# Patient Record
Sex: Female | Born: 1935 | Race: White | Hispanic: No | Marital: Married | Smoking: Never smoker
Health system: Southern US, Community
[De-identification: ages and names within clinical notes are randomized; demographics above are authoritative.]

## PROBLEM LIST (undated history)

## (undated) HISTORY — PX: CATARACT EXTRACTION: SUR2

---

## 1999-02-25 ENCOUNTER — Other Ambulatory Visit: Admission: RE | Admit: 1999-02-25 | Discharge: 1999-02-25 | Payer: Self-pay | Admitting: Internal Medicine

## 2000-06-15 ENCOUNTER — Encounter: Payer: Self-pay | Admitting: Internal Medicine

## 2000-06-15 ENCOUNTER — Encounter: Admission: RE | Admit: 2000-06-15 | Discharge: 2000-06-15 | Payer: Self-pay | Admitting: Internal Medicine

## 2001-12-20 ENCOUNTER — Encounter: Payer: Self-pay | Admitting: Internal Medicine

## 2001-12-20 ENCOUNTER — Encounter: Admission: RE | Admit: 2001-12-20 | Discharge: 2001-12-20 | Payer: Self-pay | Admitting: Internal Medicine

## 2002-02-11 ENCOUNTER — Other Ambulatory Visit: Admission: RE | Admit: 2002-02-11 | Discharge: 2002-02-11 | Payer: Self-pay | Admitting: Internal Medicine

## 2003-10-08 ENCOUNTER — Encounter: Admission: RE | Admit: 2003-10-08 | Discharge: 2003-10-08 | Payer: Self-pay | Admitting: Internal Medicine

## 2006-03-28 ENCOUNTER — Encounter: Admission: RE | Admit: 2006-03-28 | Discharge: 2006-03-28 | Payer: Self-pay | Admitting: Internal Medicine

## 2009-04-22 ENCOUNTER — Encounter: Admission: RE | Admit: 2009-04-22 | Discharge: 2009-04-22 | Payer: Self-pay | Admitting: Psychiatry

## 2009-05-14 ENCOUNTER — Encounter: Admission: RE | Admit: 2009-05-14 | Discharge: 2009-05-14 | Payer: Self-pay | Admitting: Internal Medicine

## 2010-03-02 IMAGING — CT CT CHEST W/ CM
2 of 3 series · 15 of 30 positions shown, 17 images · IV contrast (75CC OMNI 300)
Comparison: Report of abdominal CT 11/09/2007 is reviewed.  Images
are not available at the time of dictation.

CLINICAL DATA: Follow-up lung nodule.

CT CHEST WITH CONTRAST
TECHNIQUE: Multidetector CT imaging of the chest was performed
following the standard protocol during bolus administration of
intravenous contrast.
Contrast: 75 ml [id number]-[id number]

[Series 3: routine chest · axial · 0.63mm/px · z∈[-232,-12]mm · 7 of 60 slices shown, 9 images]
[im 8/60  mediastinal]
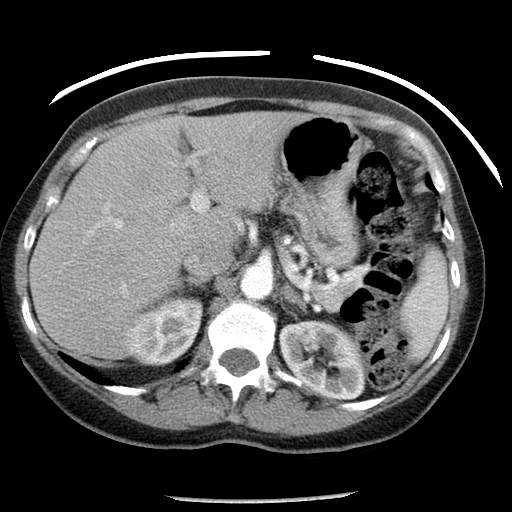
[im 8/60  lung]
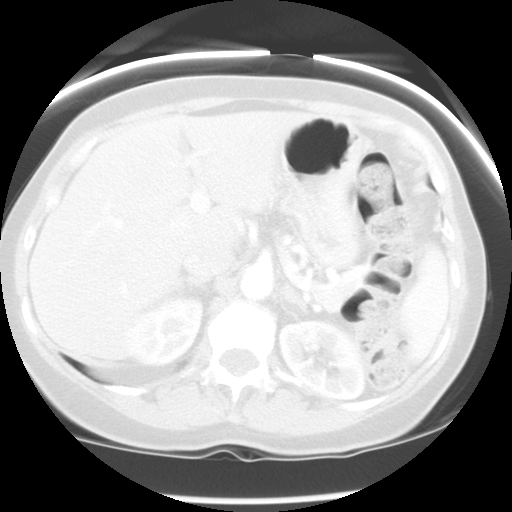
[im 15/60  lung]
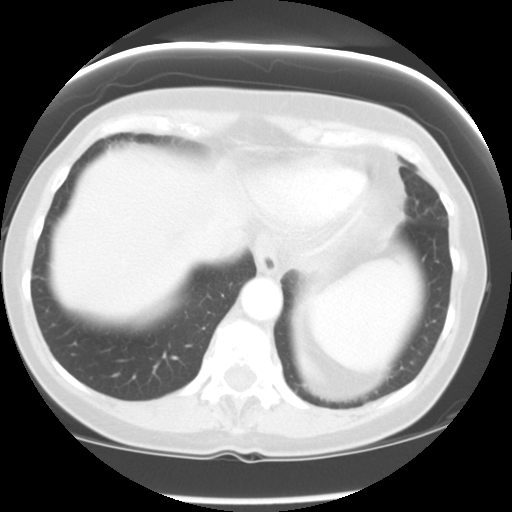
[im 23/60  lung]
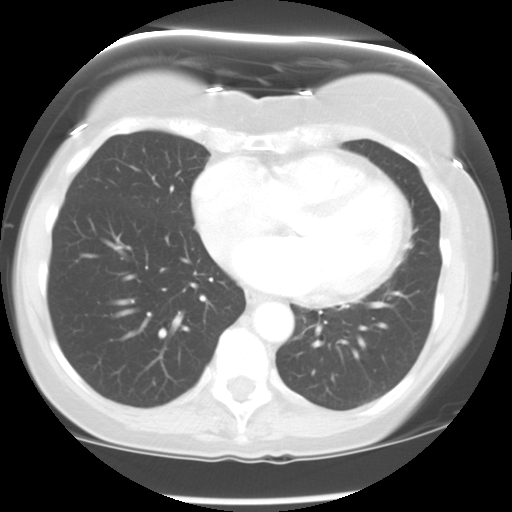
[im 30/60  lung]
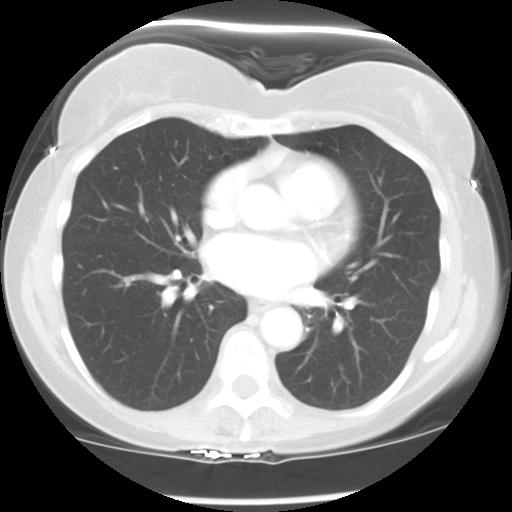
[im 37/60  mediastinal]
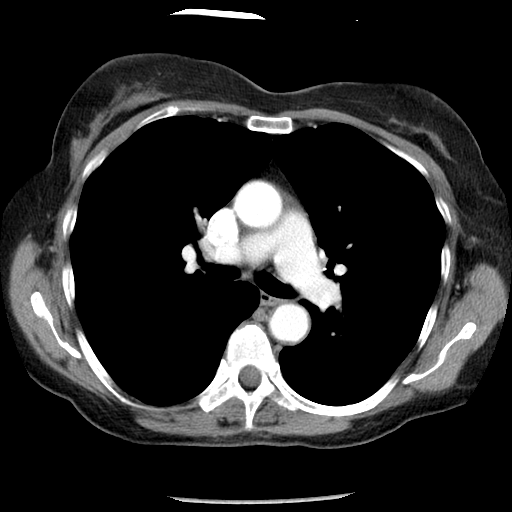
[im 37/60  lung]
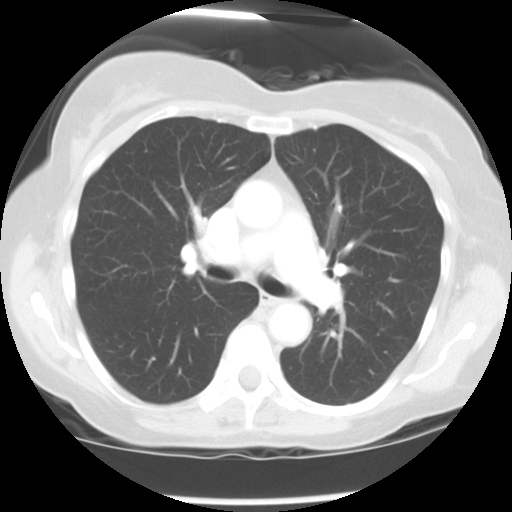
[im 45/60  lung]
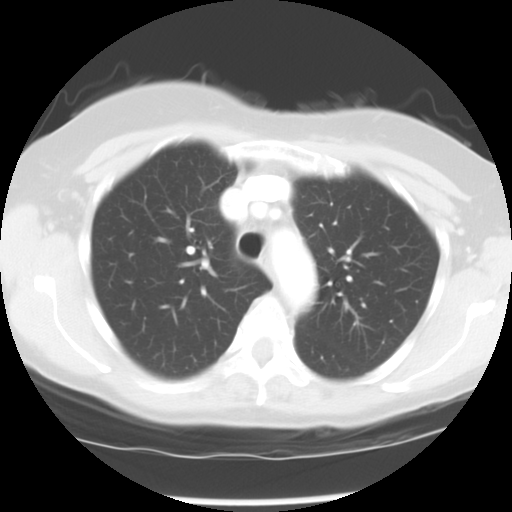
[im 52/60  lung]
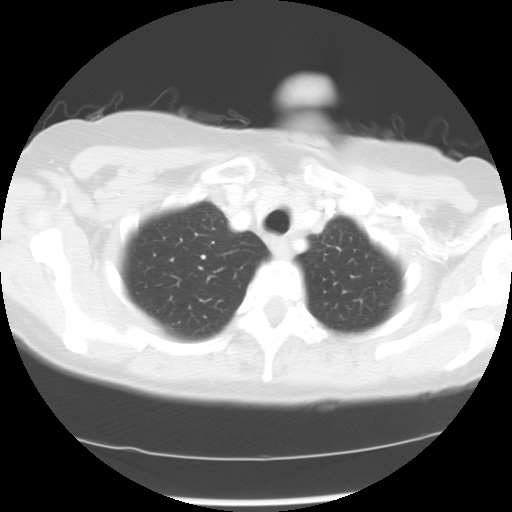

[Series 602: sagittal body · sagittal · 0.63mm/px · 8 of 130 slices shown]
[im 15/130  mediastinal]
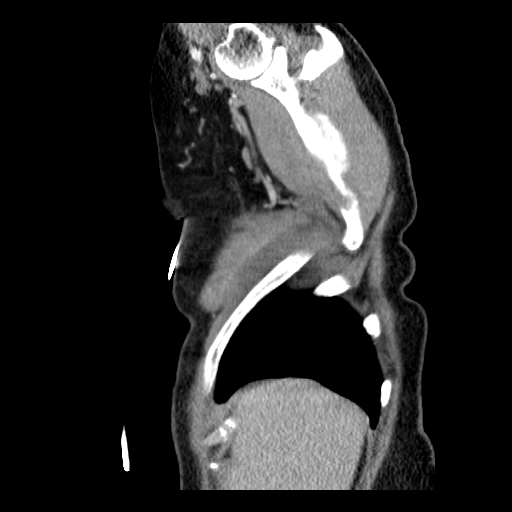
[im 29/130  mediastinal]
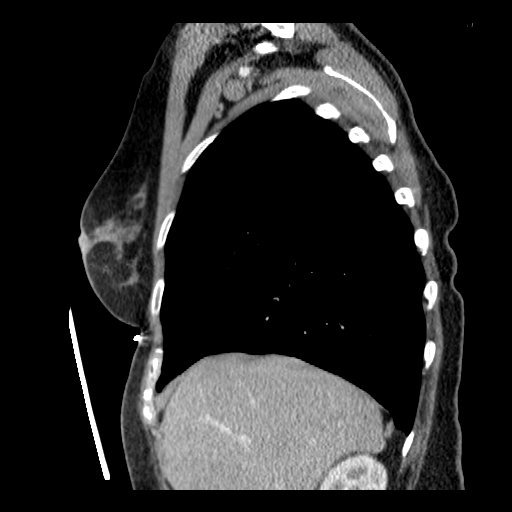
[im 44/130  mediastinal]
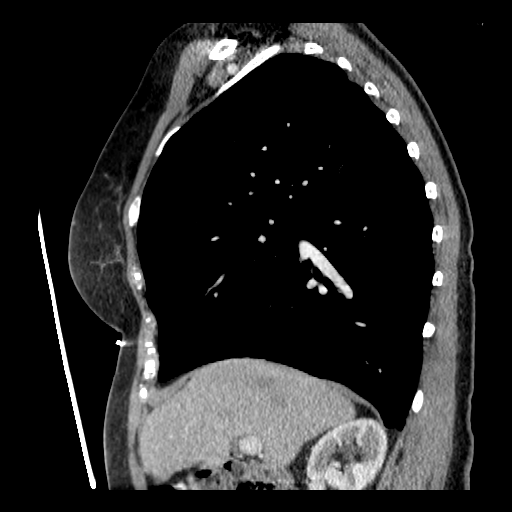
[im 58/130  mediastinal]
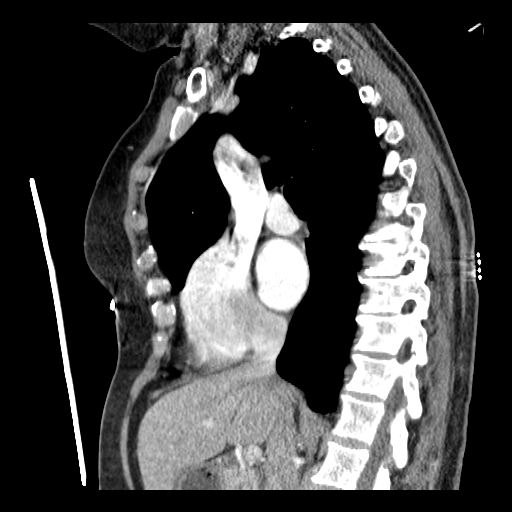
[im 72/130  mediastinal]
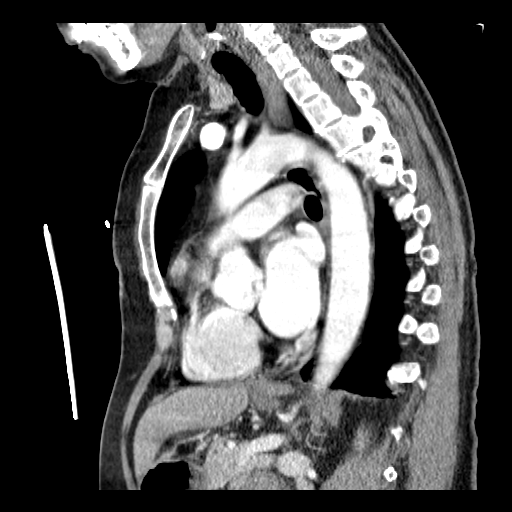
[im 87/130  mediastinal]
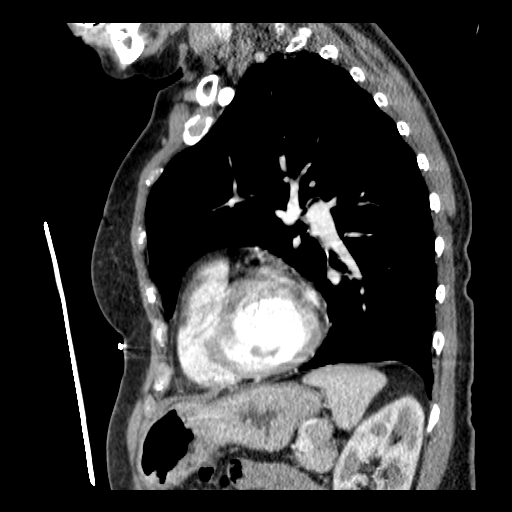
[im 101/130  mediastinal]
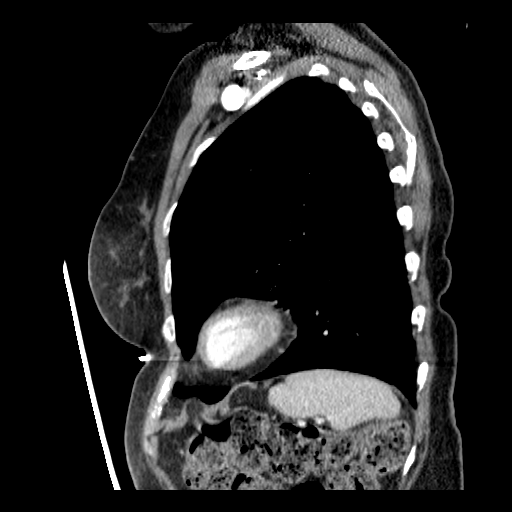
[im 115/130  mediastinal]
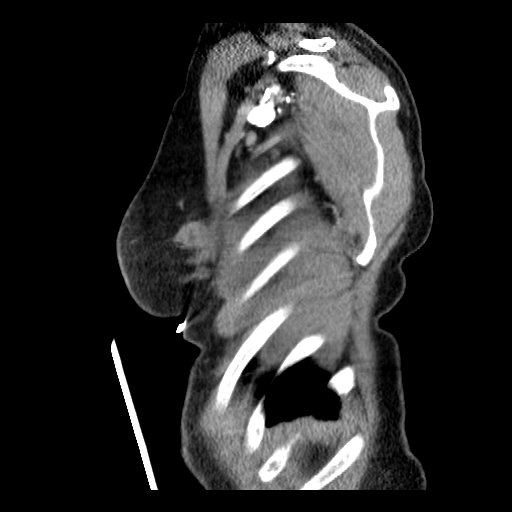

[15 of 30 positions shown; findings below may reference images not displayed]

FINDINGS: No pathologically enlarged mediastinal, hilar or axillary
lymph nodes.  Heart is enlarged.  No pericardial effusion.

Minimal biapical pleural parenchymal scarring.  There are small
subpleural nodules along the minor fissure and within the right
lower lobe.  Scarring is seen in the left lower lobe.  Lungs are
otherwise clear.  No pleural fluid.  Airway is unremarkable.

Incidental imaging of the upper abdomen shows a tiny low
attenuation lesion in the right kidney, which is likely a cyst.  No
worrisome lytic or sclerotic lesions.
IMPRESSION: Small subpleural nodules in the right lung base have the appearance
of subpleural lymph nodes.

## 2010-12-27 ENCOUNTER — Encounter: Payer: Self-pay | Admitting: Internal Medicine

## 2012-04-23 ENCOUNTER — Other Ambulatory Visit: Payer: Self-pay | Admitting: Internal Medicine

## 2012-04-23 DIAGNOSIS — Z1231 Encounter for screening mammogram for malignant neoplasm of breast: Secondary | ICD-10-CM

## 2012-05-22 ENCOUNTER — Ambulatory Visit
Admission: RE | Admit: 2012-05-22 | Discharge: 2012-05-22 | Disposition: A | Discharge status: Home / Self Care | Payer: Medicare Other | Source: Ambulatory Visit | Attending: Internal Medicine | Admitting: Internal Medicine

## 2012-05-22 DIAGNOSIS — Z1231 Encounter for screening mammogram for malignant neoplasm of breast: Secondary | ICD-10-CM

## 2014-05-16 ENCOUNTER — Other Ambulatory Visit: Payer: Self-pay | Admitting: Internal Medicine

## 2014-05-16 DIAGNOSIS — Z1231 Encounter for screening mammogram for malignant neoplasm of breast: Secondary | ICD-10-CM

## 2014-05-28 ENCOUNTER — Encounter (INDEPENDENT_AMBULATORY_CARE_PROVIDER_SITE_OTHER): Payer: Self-pay

## 2014-05-28 ENCOUNTER — Ambulatory Visit
Admission: RE | Admit: 2014-05-28 | Discharge: 2014-05-28 | Disposition: A | Discharge status: Home / Self Care | Payer: Medicare Other | Source: Ambulatory Visit | Attending: Internal Medicine | Admitting: Internal Medicine

## 2014-05-28 DIAGNOSIS — Z1231 Encounter for screening mammogram for malignant neoplasm of breast: Secondary | ICD-10-CM

## 2016-05-19 ENCOUNTER — Other Ambulatory Visit: Payer: Self-pay | Admitting: Internal Medicine

## 2016-05-19 DIAGNOSIS — Z1231 Encounter for screening mammogram for malignant neoplasm of breast: Secondary | ICD-10-CM

## 2016-06-22 ENCOUNTER — Ambulatory Visit: Payer: Self-pay

## 2019-12-15 ENCOUNTER — Ambulatory Visit: Payer: Medicare Other | Attending: Internal Medicine

## 2019-12-15 ENCOUNTER — Other Ambulatory Visit: Payer: Self-pay

## 2019-12-15 DIAGNOSIS — Z23 Encounter for immunization: Secondary | ICD-10-CM

## 2019-12-15 NOTE — Progress Notes (Signed)
   Covid-19 Vaccination Clinic  Name:  Audrey Sosa    MRN: [id number] DOB: 12/19/1929  12/15/2019  [name] was observed post Covid-19 immunization for 15 minutes without incidence. She was provided with Vaccine Information Sheet and instruction to access the V-Safe system.   [name] was instructed to call 911 with any severe reactions post vaccine: Marland Kitchen Difficulty breathing  . Swelling of your face and throat  . A fast heartbeat  . A bad rash all over your body  . Dizziness and weakness    Immunizations Administered    Name Date Dose VIS Date Route   Pfizer COVID-19 Vaccine 12/15/2019  2:22 PM 0.3 mL 11/15/2019 Intramuscular   Manufacturer: [employer]   Lot: [id number]   [place]: [id number]

## 2019-12-17 ENCOUNTER — Other Ambulatory Visit: Payer: Self-pay

## 2020-01-05 ENCOUNTER — Ambulatory Visit: Payer: Medicare Other | Attending: Internal Medicine

## 2020-01-05 DIAGNOSIS — Z23 Encounter for immunization: Secondary | ICD-10-CM | POA: Insufficient documentation

## 2020-01-05 NOTE — Progress Notes (Signed)
   Covid-19 Vaccination Clinic  Name:  ANASHA BILBREY    MRN: [id number] DOB: 1930-03-06  01/05/2020  Ms. Tennis was observed post Covid-19 immunization for 15 minutes without incidence. She was provided with Vaccine Information Sheet and instruction to access the V-Safe system.   Ms. Moretta was instructed to call 911 with any severe reactions post vaccine: Marland Kitchen Difficulty breathing  . Swelling of your face and throat  . A fast heartbeat  . A bad rash all over your body  . Dizziness and weakness    Immunizations Administered    Name Date Dose VIS Date Route   Pfizer COVID-19 Vaccine 01/05/2020  1:35 PM 0.3 mL 11/15/2019 Intramuscular   Manufacturer: [place]   Lot: [id number]   [place]: [id number]

## 2020-05-29 ENCOUNTER — Ambulatory Visit: Payer: Self-pay | Admitting: Podiatry

## 2020-12-16 DIAGNOSIS — M810 Age-related osteoporosis without current pathological fracture: Secondary | ICD-10-CM | POA: Diagnosis not present

## 2020-12-16 DIAGNOSIS — E782 Mixed hyperlipidemia: Secondary | ICD-10-CM | POA: Diagnosis not present

## 2020-12-16 DIAGNOSIS — I10 Essential (primary) hypertension: Secondary | ICD-10-CM | POA: Diagnosis not present

## 2021-01-21 DIAGNOSIS — I10 Essential (primary) hypertension: Secondary | ICD-10-CM | POA: Diagnosis not present

## 2021-04-20 DIAGNOSIS — I10 Essential (primary) hypertension: Secondary | ICD-10-CM | POA: Diagnosis not present

## 2021-04-25 DIAGNOSIS — L239 Allergic contact dermatitis, unspecified cause: Secondary | ICD-10-CM | POA: Diagnosis not present

## 2021-05-10 DIAGNOSIS — R21 Rash and other nonspecific skin eruption: Secondary | ICD-10-CM | POA: Diagnosis not present

## 2021-06-24 ENCOUNTER — Other Ambulatory Visit: Payer: Self-pay | Admitting: Critical Care Medicine

## 2021-06-24 DIAGNOSIS — Z1389 Encounter for screening for other disorder: Secondary | ICD-10-CM | POA: Diagnosis not present

## 2021-06-24 DIAGNOSIS — E559 Vitamin D deficiency, unspecified: Secondary | ICD-10-CM | POA: Diagnosis not present

## 2021-06-24 DIAGNOSIS — M810 Age-related osteoporosis without current pathological fracture: Secondary | ICD-10-CM | POA: Diagnosis not present

## 2021-06-24 DIAGNOSIS — K219 Gastro-esophageal reflux disease without esophagitis: Secondary | ICD-10-CM | POA: Diagnosis not present

## 2021-06-24 DIAGNOSIS — I10 Essential (primary) hypertension: Secondary | ICD-10-CM | POA: Diagnosis not present

## 2021-06-24 DIAGNOSIS — E782 Mixed hyperlipidemia: Secondary | ICD-10-CM | POA: Diagnosis not present

## 2021-06-24 DIAGNOSIS — Z0000 Encounter for general adult medical examination without abnormal findings: Secondary | ICD-10-CM | POA: Diagnosis not present

## 2021-06-28 ENCOUNTER — Other Ambulatory Visit: Payer: Self-pay

## 2021-06-28 ENCOUNTER — Ambulatory Visit
Admission: RE | Admit: 2021-06-28 | Discharge: 2021-06-28 | Disposition: A | Discharge status: Home / Self Care | Payer: Medicare Other | Source: Ambulatory Visit | Attending: Internal Medicine | Admitting: Internal Medicine

## 2021-06-28 DIAGNOSIS — Z780 Asymptomatic menopausal state: Secondary | ICD-10-CM | POA: Diagnosis not present

## 2021-06-28 DIAGNOSIS — M810 Age-related osteoporosis without current pathological fracture: Secondary | ICD-10-CM | POA: Diagnosis not present

## 2021-06-28 DIAGNOSIS — M8588 Other specified disorders of bone density and structure, other site: Secondary | ICD-10-CM | POA: Diagnosis not present

## 2021-07-15 DIAGNOSIS — Z961 Presence of intraocular lens: Secondary | ICD-10-CM | POA: Diagnosis not present

## 2021-07-15 DIAGNOSIS — H35363 Drusen (degenerative) of macula, bilateral: Secondary | ICD-10-CM | POA: Diagnosis not present

## 2021-07-15 DIAGNOSIS — H40013 Open angle with borderline findings, low risk, bilateral: Secondary | ICD-10-CM | POA: Diagnosis not present

## 2021-07-15 DIAGNOSIS — H524 Presbyopia: Secondary | ICD-10-CM | POA: Diagnosis not present

## 2021-07-15 DIAGNOSIS — H26493 Other secondary cataract, bilateral: Secondary | ICD-10-CM | POA: Diagnosis not present

## 2021-09-21 DIAGNOSIS — I10 Essential (primary) hypertension: Secondary | ICD-10-CM | POA: Diagnosis not present

## 2021-11-16 DIAGNOSIS — H109 Unspecified conjunctivitis: Secondary | ICD-10-CM | POA: Diagnosis not present

## 2021-12-16 DIAGNOSIS — R0989 Other specified symptoms and signs involving the circulatory and respiratory systems: Secondary | ICD-10-CM | POA: Diagnosis not present

## 2021-12-16 DIAGNOSIS — Z03818 Encounter for observation for suspected exposure to other biological agents ruled out: Secondary | ICD-10-CM | POA: Diagnosis not present

## 2021-12-16 DIAGNOSIS — L989 Disorder of the skin and subcutaneous tissue, unspecified: Secondary | ICD-10-CM | POA: Diagnosis not present

## 2021-12-16 DIAGNOSIS — J029 Acute pharyngitis, unspecified: Secondary | ICD-10-CM | POA: Diagnosis not present

## 2021-12-22 DIAGNOSIS — J209 Acute bronchitis, unspecified: Secondary | ICD-10-CM | POA: Diagnosis not present

## 2021-12-28 DIAGNOSIS — I10 Essential (primary) hypertension: Secondary | ICD-10-CM | POA: Diagnosis not present

## 2021-12-28 DIAGNOSIS — R059 Cough, unspecified: Secondary | ICD-10-CM | POA: Diagnosis not present

## 2022-01-18 DIAGNOSIS — M810 Age-related osteoporosis without current pathological fracture: Secondary | ICD-10-CM | POA: Diagnosis not present

## 2022-01-18 DIAGNOSIS — I10 Essential (primary) hypertension: Secondary | ICD-10-CM | POA: Diagnosis not present

## 2022-01-18 DIAGNOSIS — E782 Mixed hyperlipidemia: Secondary | ICD-10-CM | POA: Diagnosis not present

## 2022-02-01 DIAGNOSIS — C44311 Basal cell carcinoma of skin of nose: Secondary | ICD-10-CM | POA: Diagnosis not present

## 2022-03-01 DIAGNOSIS — Z85828 Personal history of other malignant neoplasm of skin: Secondary | ICD-10-CM | POA: Diagnosis not present

## 2022-03-01 DIAGNOSIS — Z08 Encounter for follow-up examination after completed treatment for malignant neoplasm: Secondary | ICD-10-CM | POA: Diagnosis not present

## 2022-04-25 DIAGNOSIS — L570 Actinic keratosis: Secondary | ICD-10-CM | POA: Diagnosis not present

## 2022-04-25 DIAGNOSIS — X32XXXD Exposure to sunlight, subsequent encounter: Secondary | ICD-10-CM | POA: Diagnosis not present

## 2022-07-07 DIAGNOSIS — I10 Essential (primary) hypertension: Secondary | ICD-10-CM | POA: Diagnosis not present

## 2022-07-07 DIAGNOSIS — M810 Age-related osteoporosis without current pathological fracture: Secondary | ICD-10-CM | POA: Diagnosis not present

## 2022-07-07 DIAGNOSIS — Z0000 Encounter for general adult medical examination without abnormal findings: Secondary | ICD-10-CM | POA: Diagnosis not present

## 2022-07-07 DIAGNOSIS — E559 Vitamin D deficiency, unspecified: Secondary | ICD-10-CM | POA: Diagnosis not present

## 2022-07-07 DIAGNOSIS — R54 Age-related physical debility: Secondary | ICD-10-CM | POA: Diagnosis not present

## 2022-07-07 DIAGNOSIS — E782 Mixed hyperlipidemia: Secondary | ICD-10-CM | POA: Diagnosis not present

## 2022-07-15 DIAGNOSIS — H35363 Drusen (degenerative) of macula, bilateral: Secondary | ICD-10-CM | POA: Diagnosis not present

## 2022-07-15 DIAGNOSIS — H40013 Open angle with borderline findings, low risk, bilateral: Secondary | ICD-10-CM | POA: Diagnosis not present

## 2022-07-15 DIAGNOSIS — H26493 Other secondary cataract, bilateral: Secondary | ICD-10-CM | POA: Diagnosis not present

## 2022-07-15 DIAGNOSIS — H524 Presbyopia: Secondary | ICD-10-CM | POA: Diagnosis not present

## 2022-07-15 DIAGNOSIS — H04123 Dry eye syndrome of bilateral lacrimal glands: Secondary | ICD-10-CM | POA: Diagnosis not present

## 2022-07-18 NOTE — Progress Notes (Signed)
  [facility] Note  07/20/2022     CHIEF COMPLAINT Patient presents for No chief complaint on file.   HISTORY OF PRESENT ILLNESS: Audrey Sosa is a 88 y.o. female who presents to the clinic today for:     Referring physician: Wenda Low, MD [address] [place],  [place] [address]  HISTORICAL INFORMATION:   Selected notes from the MEDICAL RECORD NUMBER Referred by Dr. Truman Hayward:  Ocular Hx- PMH-    CURRENT MEDICATIONS: No current outpatient medications on file. (Ophthalmic Drugs)   No current facility-administered medications for this visit. (Ophthalmic Drugs)   No current outpatient medications on file. (Other)   No current facility-administered medications for this visit. (Other)      REVIEW OF SYSTEMS:    ALLERGIES Not on File  PAST MEDICAL HISTORY No past medical history on file. *** The histories are not reviewed yet. Please review them in the "History" navigator section and refresh this National.  FAMILY HISTORY No family history on file.  SOCIAL HISTORY         OPHTHALMIC EXAM:  Not recorded     IMAGING AND PROCEDURES  Imaging and Procedures for 07/20/2022           ASSESSMENT/PLAN:  No diagnosis found.  1.  2.  3.  Ophthalmic Meds Ordered this visit:  No orders of the defined types were placed in this encounter.      No follow-ups on file.  There are no Patient Instructions on file for this visit.   Explained the diagnoses, plan, and follow up with the patient and they expressed understanding.  Patient expressed understanding of the importance of proper follow up care.   Gardiner Sleeper, M.D., Ph.D. Diseases & Surgery of the Retina and [place] '@TODAY'$ @     Abbreviations: M myopia (nearsighted); A astigmatism; H hyperopia (farsighted); P presbyopia; Mrx spectacle prescription;  CTL contact lenses; OD right eye; OS left eye; OU both  eyes  XT exotropia; ET esotropia; PEK punctate epithelial keratitis; PEE punctate epithelial erosions; DES dry eye syndrome; MGD meibomian gland dysfunction; ATs artificial tears; PFAT's preservative free artificial tears; [place] nuclear sclerotic cataract; PSC posterior subcapsular cataract; ERM epi-retinal membrane; PVD posterior vitreous detachment; RD retinal detachment; DM diabetes mellitus; DR diabetic retinopathy; NPDR non-proliferative diabetic retinopathy; PDR proliferative diabetic retinopathy; CSME clinically significant macular edema; DME diabetic macular edema; dbh dot blot hemorrhages; CWS cotton wool spot; POAG primary open angle glaucoma; C/D cup-to-disc ratio; HVF humphrey visual field; GVF goldmann visual field; OCT optical coherence tomography; IOP intraocular pressure; BRVO Branch retinal vein occlusion; CRVO central retinal vein occlusion; CRAO central retinal artery occlusion; BRAO branch retinal artery occlusion; RT retinal tear; SB scleral buckle; PPV pars plana vitrectomy; VH Vitreous hemorrhage; PRP panretinal laser photocoagulation; IVK intravitreal kenalog; VMT vitreomacular traction; MH Macular hole;  NVD neovascularization of the disc; NVE neovascularization elsewhere; AREDS age related eye disease study; ARMD age related macular degeneration; POAG primary open angle glaucoma; EBMD epithelial/anterior basement membrane dystrophy; ACIOL anterior chamber intraocular lens; IOL intraocular lens; PCIOL posterior chamber intraocular lens; Phaco/IOL phacoemulsification with intraocular lens placement; [place] photorefractive keratectomy; LASIK laser assisted in situ keratomileusis; HTN hypertension; DM diabetes mellitus; COPD chronic obstructive pulmonary disease

## 2022-07-20 ENCOUNTER — Encounter (INDEPENDENT_AMBULATORY_CARE_PROVIDER_SITE_OTHER): Payer: Self-pay | Admitting: Ophthalmology

## 2022-07-20 ENCOUNTER — Ambulatory Visit (INDEPENDENT_AMBULATORY_CARE_PROVIDER_SITE_OTHER): Payer: Medicare Other | Admitting: Ophthalmology

## 2022-07-20 DIAGNOSIS — Z961 Presence of intraocular lens: Secondary | ICD-10-CM | POA: Diagnosis not present

## 2022-07-20 DIAGNOSIS — H353231 Exudative age-related macular degeneration, bilateral, with active choroidal neovascularization: Secondary | ICD-10-CM | POA: Diagnosis not present

## 2022-07-20 DIAGNOSIS — H35033 Hypertensive retinopathy, bilateral: Secondary | ICD-10-CM

## 2022-07-20 DIAGNOSIS — I10 Essential (primary) hypertension: Secondary | ICD-10-CM

## 2022-07-20 DIAGNOSIS — H3581 Retinal edema: Secondary | ICD-10-CM

## 2022-07-20 MED ORDER — BEVACIZUMAB CHEMO INJECTION 1.25MG/0.05ML SYRINGE FOR KALEIDOSCOPE
1.2500 mg | INTRAVITREAL | Status: AC | PRN
  Administered 2022-07-20: 1.25 mg via INTRAVITREAL

## 2022-08-11 NOTE — Progress Notes (Signed)
[facility] Note  08/17/2022    CHIEF COMPLAINT Patient presents for Retina Follow Up   HISTORY OF PRESENT ILLNESS: Audrey Sosa is a 88 y.o. female who presents to the clinic today for:   HPI     Retina Follow Up   Patient presents with  Wet AMD.  In both eyes.  This started months ago.  Duration of 4 weeks.  Since onset it is stable.  I, the attending physician,  performed the HPI with the patient and updated documentation appropriately.        Comments   Patient denies noticing any vision changes at this time.       Last edited by Bernarda Caffey, MD on 08/17/2022 10:08 PM.    Pt states no problems with first injection at last visit, no change in vision  Referring physician: Lisabeth Pick, MD [place],  [place] [address]  HISTORICAL INFORMATION:   Selected notes from the MEDICAL RECORD NUMBER Referred by Dr. Lucianne Lei for ex ARMD LEE:  Ocular Hx- PMH-    CURRENT MEDICATIONS: No current outpatient medications on file. (Ophthalmic Drugs)   No current facility-administered medications for this visit. (Ophthalmic Drugs)   Current Outpatient Medications (Other)  Medication Sig   Cholecalciferol (VITAMIN D3) 1.25 MG (50000 UT) CAPS Take by mouth as directed.   Multiple Vitamins-Minerals (MULTIVITAMIN ADULT EXTRA C PO)    raloxifene (EVISTA) 60 MG tablet Take 60 mg by mouth daily.   simvastatin (ZOCOR) 20 MG tablet Take 20 mg by mouth daily.   valsartan-hydrochlorothiazide (DIOVAN-HCT) 160-12.5 MG tablet Take by mouth as directed.   No current facility-administered medications for this visit. (Other)   REVIEW OF SYSTEMS: ROS   Positive for: Eyes Negative for: Constitutional, Gastrointestinal, Neurological, Skin, Genitourinary, Musculoskeletal, HENT, Endocrine, Cardiovascular, Respiratory, Psychiatric, Allergic/Imm, Heme/Lymph Last edited by Annie Paras, COT on 08/17/2022  2:17 PM.     ALLERGIES No Known  Allergies  PAST MEDICAL HISTORY History reviewed. No pertinent past medical history. Past Surgical History:  Procedure Laterality Date   CATARACT EXTRACTION     FAMILY HISTORY Family History  Problem Relation Age of Onset   Macular degeneration Mother    Macular degeneration Father    SOCIAL HISTORY Social History   Tobacco Use   Smoking status: Never   Smokeless tobacco: Never       OPHTHALMIC EXAM:  Base Eye Exam     Visual Acuity (Snellen - Linear)       Right Left   Dist Thermal 20/25 +2 20/40   Dist ph [place]  NI         Tonometry (Tonopen, 2:20 PM)       Right Left   Pressure 14 14         Pupils       Dark Light Shape React APD   Right 3 2 Round Slow None   Left 3 2 Round Slow None         Visual Fields       Left Right    Full Full         Extraocular Movement       Right Left    Full, Ortho Full, Ortho         Neuro/Psych     Oriented x3: Yes   Mood/Affect: Normal         Dilation     Both eyes: 1.0% Mydriacyl, 2.5% Phenylephrine @  2:17 PM           Slit Lamp and Fundus Exam     Slit Lamp Exam       Right Left   Lids/Lashes Dermatochalasis - upper lid Dermatochalasis - upper lid   Conjunctiva/Sclera White and quiet White and quiet   Cornea arcus, trace PEE, mild tear film debris arcus, trace PEE, mild tear film debris   Anterior Chamber Deep and quiet Deep and quiet   Iris Round and dilated Round and dilated   Lens PC IOL in good position, trace Posterior capsular opacification PC IOL in good position, 1+ Posterior capsular opacification   Anterior Vitreous Vitreous syneresis Vitreous syneresis         Fundus Exam       Right Left   Disc Pink and Sharp, Compact, temporal PPA, peripapillary CNE with edema superior disc, no heme mild Pallor, Sharp rim, PPA   C/D Ratio 0.5 0.6   Macula Flat, Blunted foveal reflex, fine Drusen, RPE mottling and clumping, No heme or edema Blunted foveal reflex, central CNV with  edema - improved, no frank heme, Drusen, RPE mottling   Vessels mild attenuation, mild tortuosity attenuated, Tortuous   Periphery Attached, mild reticular degeneration, No heme Attached, mild reticular degeneration, No heme           IMAGING AND PROCEDURES  Imaging and Procedures for 08/17/2022  OCT, Retina - OU - Both Eyes       Right Eye Quality was good. Central Foveal Thickness: 297. Progression has improved. Findings include normal foveal contour, no IRF, retinal drusen , subretinal hyper-reflective material, pigment epithelial detachment, subretinal fluid (Peripapillary PED / CNV with mild cystic changes superior to disc - slightly improved - caught on widefield, Mild ERM).   Left Eye Quality was good. Central Foveal Thickness: 490. Progression has improved. Findings include abnormal foveal contour, intraretinal fluid, pigment epithelial detachment, subretinal fluid (Interval improvement in SRF and foveal contour overlying stable PED).   Notes *Images captured and stored on drive  Diagnosis / Impression:  Exu ARMD OU OD: Peripapillary PED / CNV with mild cystic changes superior to disc - slightly improved - caught on widefield; Mild ERM OS: Interval improvement in SRF and foveal contour overlying stable PED  Clinical management:  See below  Abbreviations: NFP - Normal foveal profile. CME - cystoid macular edema. PED - pigment epithelial detachment. IRF - intraretinal fluid. SRF - subretinal fluid. EZ - ellipsoid zone. ERM - epiretinal membrane. ORA - outer retinal atrophy. ORT - outer retinal tubulation. SRHM - subretinal hyper-reflective material. IRHM - intraretinal hyper-reflective material      Intravitreal Injection, Pharmacologic Agent - OS - Left Eye       Time Out 08/17/2022. 3:12 PM. Confirmed correct patient, procedure, site, and patient consented.   Anesthesia Topical anesthesia was used. Anesthetic medications included Lidocaine 4%, Proparacaine 0.5%.    Procedure Preparation included 5% betadine to ocular surface, eyelid speculum. A (32g) needle was used.   Injection: 1.25 mg Bevacizumab 1.'25mg'$ /0.54m   Route: Intravitreal, Site: Left Eye   NDC: 5H061816 Lot:: [id number] Expiration date: 09/27/2022   Post-op Post injection exam found visual acuity of at least counting fingers. The patient tolerated the procedure well. There were no complications. The patient received written and verbal post procedure care education. Post injection medications were not given.      Fluorescein Angiography Optos (Transit OS)       Right Eye Progression has no prior data. Early phase  findings include staining, choroidal neovascularization. Mid/Late phase findings include leakage, staining, choroidal neovascularization (Focal peripapillary CNV with leakage superior disc).   Left Eye Progression has no prior data. Early phase findings include blockage, staining, choroidal neovascularization. Mid/Late phase findings include blockage, leakage, staining, choroidal neovascularization (Central CNV with central blockage from heme, +leakage).   Notes **Images stored on drive**  Impression: Exudative ARMD OU OD: Focal peripapillary CNV with leakage superior disc OS: Central CNV with central blockage from heme, +leakage      Intravitreal Injection, Pharmacologic Agent - OD - Right Eye       Time Out 08/17/2022. 3:19 PM. Confirmed correct patient, procedure, site, and patient consented.   Anesthesia Topical anesthesia was used. Anesthetic medications included Lidocaine 2%, Proparacaine 0.5%.   Procedure Preparation included 5% betadine to ocular surface, eyelid speculum. A supplied needle was used.   Injection: 1.25 mg Bevacizumab 1.'25mg'$ /0.30m   Route: Intravitreal, Site: Right Eye   NDC: 5H061816 Lot: [id number]'@[id number]'$ , Expiration date: 10/05/2022   Post-op Post injection exam found visual acuity of at least counting fingers. The patient  tolerated the procedure well. There were no complications. The patient received written and verbal post procedure care education.            ASSESSMENT/PLAN:    ICD-10-CM   1. Exudative age-related macular degeneration of both eyes with active choroidal neovascularization (HCC)  H35.3231 OCT, Retina - OU - Both Eyes    Intravitreal Injection, Pharmacologic Agent - OS - Left Eye    Fluorescein Angiography Optos (Transit OS)    Intravitreal Injection, Pharmacologic Agent - OD - Right Eye    Bevacizumab (AVASTIN) SOLN 1.25 mg    Bevacizumab (AVASTIN) SOLN 1.25 mg    2. Essential hypertension  I10     3. Hypertensive retinopathy of both eyes  H35.033 Fluorescein Angiography Optos (Transit OS)    4. Pseudophakia, both eyes  Z96.1      Exudative age related macular degeneration, OU  - OD: small peripapillary CNV  - OS: central CNV  - s/p IVA OS #1 (08.16.23)  - BCVA 20/20 OD, 20/40 OS  - OCT shows OD: Peripapillary PED / CNV with mild cystic changes superior to disc - slightly improved - caught on widefield, Mild ERM; OS: Interval improvement in SRF and foveal contour overlying stable PED  - FA (09.13.23) shows OD: Focal peripapillary CNV with leakage superior disc; OS: Central CNV with central blockage from heme, +leakage  - recommend IVA OU (OD #1 and OS #2) today, 09.13.23 - pt wishes to proceed with injection - RBA of procedure discussed, questions answered - IVA informed consent obtained and signed, 09.13.23 (OU) - see procedure note  - f/u in 4 wks -- DFE/OCT/possible injection(s)  2,3. Hypertensive retinopathy OU - discussed importance of tight BP control - monitor  4. Pseudophakia OU  - s/p CE/IOL OU (Dr. [id number]  - IOLs in good position, doing well  - monitor  Ophthalmic Meds Ordered this visit:  Meds ordered this encounter  Medications   Bevacizumab (AVASTIN) SOLN 1.25 mg   Bevacizumab (AVASTIN) SOLN 1.25 mg     Return in about 4 weeks (around 09/14/2022) for  f/u exu ARMD OU, DFE, OCT.  There are no Patient Instructions on file for this visit.  Explained the diagnoses, plan, and follow up with the patient and they expressed understanding.  Patient expressed understanding of the importance of proper follow up care.   This document serves as a record of services personally  performed by Gardiner Sleeper, MD, PhD. It was created on their behalf by Orvan Falconer, an ophthalmic technician. The creation of this record is the provider's dictation and/or activities during the visit.    Electronically signed by: Orvan Falconer, OA, 08/21/22  1:32 AM This document serves as a record of services personally performed by Gardiner Sleeper, MD, PhD. It was created on their behalf by San Jetty. Owens Shark, OA an ophthalmic technician. The creation of this record is the provider's dictation and/or activities during the visit.    Electronically signed by: San Jetty. Owens Shark, [place] 09.13.2023 1:32 AM  Gardiner Sleeper, M.D., Ph.D. Diseases & Surgery of the Retina and Vitreous [facility]  I have reviewed the above documentation for accuracy and completeness, and I agree with the above. Gardiner Sleeper, M.D., Ph.D. 08/21/22 1:33 AM   Abbreviations: M myopia (nearsighted); A astigmatism; H hyperopia (farsighted); P presbyopia; Mrx spectacle prescription;  CTL contact lenses; OD right eye; OS left eye; OU both eyes  XT exotropia; ET esotropia; PEK punctate epithelial keratitis; PEE punctate epithelial erosions; DES dry eye syndrome; MGD meibomian gland dysfunction; ATs artificial tears; PFAT's preservative free artificial tears; [place] nuclear sclerotic cataract; PSC posterior subcapsular cataract; ERM epi-retinal membrane; PVD posterior vitreous detachment; RD retinal detachment; DM diabetes mellitus; DR diabetic retinopathy; NPDR non-proliferative diabetic retinopathy; PDR proliferative diabetic retinopathy; CSME clinically significant macular edema; DME  diabetic macular edema; dbh dot blot hemorrhages; CWS cotton wool spot; POAG primary open angle glaucoma; C/D cup-to-disc ratio; HVF humphrey visual field; GVF goldmann visual field; OCT optical coherence tomography; IOP intraocular pressure; BRVO Branch retinal vein occlusion; CRVO central retinal vein occlusion; CRAO central retinal artery occlusion; BRAO branch retinal artery occlusion; RT retinal tear; SB scleral buckle; PPV pars plana vitrectomy; VH Vitreous hemorrhage; PRP panretinal laser photocoagulation; IVK intravitreal kenalog; VMT vitreomacular traction; MH Macular hole;  NVD neovascularization of the disc; NVE neovascularization elsewhere; AREDS age related eye disease study; ARMD age related macular degeneration; POAG primary open angle glaucoma; EBMD epithelial/anterior basement membrane dystrophy; ACIOL anterior chamber intraocular lens; IOL intraocular lens; PCIOL posterior chamber intraocular lens; Phaco/IOL phacoemulsification with intraocular lens placement; [place] photorefractive keratectomy; LASIK laser assisted in situ keratomileusis; HTN hypertension; DM diabetes mellitus; COPD chronic obstructive pulmonary disease

## 2022-08-17 ENCOUNTER — Ambulatory Visit (INDEPENDENT_AMBULATORY_CARE_PROVIDER_SITE_OTHER): Payer: Medicare Other | Admitting: Ophthalmology

## 2022-08-17 ENCOUNTER — Encounter (INDEPENDENT_AMBULATORY_CARE_PROVIDER_SITE_OTHER): Payer: Self-pay | Admitting: Ophthalmology

## 2022-08-17 DIAGNOSIS — H353231 Exudative age-related macular degeneration, bilateral, with active choroidal neovascularization: Secondary | ICD-10-CM | POA: Diagnosis not present

## 2022-08-17 DIAGNOSIS — I10 Essential (primary) hypertension: Secondary | ICD-10-CM | POA: Diagnosis not present

## 2022-08-17 DIAGNOSIS — H35033 Hypertensive retinopathy, bilateral: Secondary | ICD-10-CM | POA: Diagnosis not present

## 2022-08-17 DIAGNOSIS — Z961 Presence of intraocular lens: Secondary | ICD-10-CM

## 2022-08-17 MED ORDER — BEVACIZUMAB CHEMO INJECTION 1.25MG/0.05ML SYRINGE FOR KALEIDOSCOPE
1.2500 mg | INTRAVITREAL | Status: AC | PRN
  Administered 2022-08-17: 1.25 mg via INTRAVITREAL

## 2022-09-14 ENCOUNTER — Encounter (INDEPENDENT_AMBULATORY_CARE_PROVIDER_SITE_OTHER): Payer: Medicare Other | Admitting: Ophthalmology

## 2022-09-16 NOTE — Progress Notes (Shared)
[facility] Note  09/30/2022    CHIEF COMPLAINT Patient presents for No chief complaint on file.   HISTORY OF PRESENT ILLNESS: Audrey Sosa is a 88 y.o. female who presents to the clinic today for:    Pt states no problems with first injection at last visit, no change in vision  Referring physician: Wenda Low, MD [address] [place],  [place] [address]  HISTORICAL INFORMATION:   Selected notes from the MEDICAL RECORD NUMBER Referred by Dr. Lucianne Lei for ex ARMD LEE:  Ocular Hx- PMH-    CURRENT MEDICATIONS: No current outpatient medications on file. (Ophthalmic Drugs)   No current facility-administered medications for this visit. (Ophthalmic Drugs)   Current Outpatient Medications (Other)  Medication Sig   Cholecalciferol (VITAMIN D3) 1.25 MG (50000 UT) CAPS Take by mouth as directed.   Multiple Vitamins-Minerals (MULTIVITAMIN ADULT EXTRA C PO)    raloxifene (EVISTA) 60 MG tablet Take 60 mg by mouth daily.   simvastatin (ZOCOR) 20 MG tablet Take 20 mg by mouth daily.   valsartan-hydrochlorothiazide (DIOVAN-HCT) 160-12.5 MG tablet Take by mouth as directed.   No current facility-administered medications for this visit. (Other)   REVIEW OF SYSTEMS:   ALLERGIES No Known Allergies  PAST MEDICAL HISTORY No past medical history on file. Past Surgical History:  Procedure Laterality Date   CATARACT EXTRACTION     FAMILY HISTORY Family History  Problem Relation Age of Onset   Macular degeneration Mother    Macular degeneration Father    SOCIAL HISTORY Social History   Tobacco Use   Smoking status: Never   Smokeless tobacco: Never       OPHTHALMIC EXAM:  Not recorded    IMAGING AND PROCEDURES  Imaging and Procedures for 09/30/2022          ASSESSMENT/PLAN:    ICD-10-CM   1. Exudative age-related macular degeneration of both eyes with active choroidal neovascularization ([place])  H35.3231     2.  Essential hypertension  I10     3. Hypertensive retinopathy of both eyes  H35.033     4. Pseudophakia, both eyes  Z96.1       Exudative age related macular degeneration, OU  - OD: small peripapillary CNV  - OS: central CNV  - s/p IVA OS #1 (09.13.23)  - s/p IVA OS #1 (08.16.23), #2 (09.13.23)  - BCVA 20/20 OD, 20/40 OS  - OCT shows OD: Peripapillary PED / CNV with mild cystic changes superior to disc - slightly improved - caught on widefield, Mild ERM; OS: Interval improvement in SRF and foveal contour overlying stable PED  - FA (09.13.23) shows OD: Focal peripapillary CNV with leakage superior disc; OS: Central CNV with central blockage from heme, +leakage  - recommend IVA OU (OD #2 and OS #3) today, 10.27.23 - pt wishes to proceed with injection - RBA of procedure discussed, questions answered - IVA informed consent obtained and signed, 09.13.23 (OU) - see procedure note  - f/u in 4 wks -- DFE/OCT/possible injection(s)  2,3. Hypertensive retinopathy OU - discussed importance of tight BP control - monitor  4. Pseudophakia OU  - s/p CE/IOL OU (Dr. Herbert Deaner)  - IOLs in good position, doing well  - monitor  Ophthalmic Meds Ordered this visit:  No orders of the defined types were placed in this encounter.    No follow-ups on file.  There are no Patient Instructions on file for this visit.  Explained the diagnoses,  plan, and follow up with the patient and they expressed understanding.  Patient expressed understanding of the importance of proper follow up care.   This document serves as a record of services personally performed by Gardiner Sleeper, MD, PhD. It was created on their behalf by Renaldo Reel, [place] an ophthalmic technician. The creation of this record is the provider's dictation and/or activities during the visit.    Electronically signed by:  Renaldo Reel, COT  10.13.23 9:35 AM   Gardiner Sleeper, M.D., Ph.D. Diseases & Surgery of the Retina and  Vitreous [facility]: M myopia (nearsighted); A astigmatism; H hyperopia (farsighted); P presbyopia; Mrx spectacle prescription;  CTL contact lenses; OD right eye; OS left eye; OU both eyes  XT exotropia; ET esotropia; PEK punctate epithelial keratitis; PEE punctate epithelial erosions; DES dry eye syndrome; MGD meibomian gland dysfunction; ATs artificial tears; PFAT's preservative free artificial tears; [place] nuclear sclerotic cataract; PSC posterior subcapsular cataract; ERM epi-retinal membrane; PVD posterior vitreous detachment; RD retinal detachment; DM diabetes mellitus; DR diabetic retinopathy; NPDR non-proliferative diabetic retinopathy; PDR proliferative diabetic retinopathy; CSME clinically significant macular edema; DME diabetic macular edema; dbh dot blot hemorrhages; CWS cotton wool spot; POAG primary open angle glaucoma; C/D cup-to-disc ratio; HVF humphrey visual field; GVF goldmann visual field; OCT optical coherence tomography; IOP intraocular pressure; BRVO Branch retinal vein occlusion; CRVO central retinal vein occlusion; CRAO central retinal artery occlusion; BRAO branch retinal artery occlusion; RT retinal tear; SB scleral buckle; PPV pars plana vitrectomy; VH Vitreous hemorrhage; PRP panretinal laser photocoagulation; IVK intravitreal kenalog; VMT vitreomacular traction; MH Macular hole;  NVD neovascularization of the disc; NVE neovascularization elsewhere; AREDS age related eye disease study; ARMD age related macular degeneration; POAG primary open angle glaucoma; EBMD epithelial/anterior basement membrane dystrophy; ACIOL anterior chamber intraocular lens; IOL intraocular lens; PCIOL posterior chamber intraocular lens; Phaco/IOL phacoemulsification with intraocular lens placement; [place] photorefractive keratectomy; LASIK laser assisted in situ keratomileusis; HTN hypertension; DM diabetes mellitus; COPD chronic obstructive pulmonary disease

## 2022-09-29 DIAGNOSIS — R54 Age-related physical debility: Secondary | ICD-10-CM | POA: Diagnosis not present

## 2022-09-29 DIAGNOSIS — Z23 Encounter for immunization: Secondary | ICD-10-CM | POA: Diagnosis not present

## 2022-09-29 DIAGNOSIS — I10 Essential (primary) hypertension: Secondary | ICD-10-CM | POA: Diagnosis not present

## 2022-09-30 ENCOUNTER — Encounter (INDEPENDENT_AMBULATORY_CARE_PROVIDER_SITE_OTHER): Payer: Medicare Other | Admitting: Ophthalmology

## 2022-09-30 DIAGNOSIS — I10 Essential (primary) hypertension: Secondary | ICD-10-CM

## 2022-09-30 DIAGNOSIS — H35033 Hypertensive retinopathy, bilateral: Secondary | ICD-10-CM

## 2022-09-30 DIAGNOSIS — H353231 Exudative age-related macular degeneration, bilateral, with active choroidal neovascularization: Secondary | ICD-10-CM

## 2022-09-30 DIAGNOSIS — Z961 Presence of intraocular lens: Secondary | ICD-10-CM

## 2022-10-04 NOTE — Progress Notes (Signed)
[facility] Note  10/18/2022    CHIEF COMPLAINT Patient presents for Retina Follow Up   HISTORY OF PRESENT ILLNESS: Audrey Sosa is a 88 y.o. female who presents to the clinic today for:   HPI     Retina Follow Up   Patient presents with  Wet AMD.  In both eyes.  This started 4 weeks ago.  Duration of 4 weeks.  Since onset it is stable.  I, the attending physician,  performed the HPI with the patient and updated documentation appropriately.        Comments   4 week retina EX AMRD OU and IVA OU pt states no vision changes noticed       Last edited by Bernarda Caffey, MD on 10/18/2022 11:04 PM.     Pt states no problems with first injection at last visit, no change in vision  Referring physician: Wenda Low, MD [address] [place],  [place] [address]  HISTORICAL INFORMATION:   Selected notes from the MEDICAL RECORD NUMBER Referred by Dr. Lucianne Lei for ex ARMD LEE:  Ocular Hx- PMH-    CURRENT MEDICATIONS: No current outpatient medications on file. (Ophthalmic Drugs)   No current facility-administered medications for this visit. (Ophthalmic Drugs)   Current Outpatient Medications (Other)  Medication Sig   Cholecalciferol (VITAMIN D3) 1.25 MG (50000 UT) CAPS Take by mouth as directed.   Multiple Vitamins-Minerals (MULTIVITAMIN ADULT EXTRA C PO)    raloxifene (EVISTA) 60 MG tablet Take 60 mg by mouth daily.   simvastatin (ZOCOR) 20 MG tablet Take 20 mg by mouth daily.   valsartan-hydrochlorothiazide (DIOVAN-HCT) 160-12.5 MG tablet Take by mouth as directed.   No current facility-administered medications for this visit. (Other)   REVIEW OF SYSTEMS:   ALLERGIES No Known Allergies  PAST MEDICAL HISTORY History reviewed. No pertinent past medical history. Past Surgical History:  Procedure Laterality Date   CATARACT EXTRACTION     FAMILY HISTORY Family History  Problem Relation Age of Onset   Macular degeneration  Mother    Macular degeneration Father    SOCIAL HISTORY Social History   Tobacco Use   Smoking status: Never   Smokeless tobacco: Never       OPHTHALMIC EXAM:  Base Eye Exam     Visual Acuity (Snellen - Linear)       Right Left   Dist [place] 20/25 -2 20/40 -1   Dist ph [place] NI NI         Tonometry (Tonopen, 2:04 PM)       Right Left   Pressure 10 10         Pupils       Pupils Dark Light Shape React APD   Right PERRL 3 2 Round Minimal None   Left PERRL 3 2 Round Minimal None         Visual Fields       Left Right    Full Full         Extraocular Movement       Right Left    Full, Ortho Full, Ortho         Neuro/Psych     Oriented x3: Yes   Mood/Affect: Normal         Dilation     Both eyes: 2.5% Phenylephrine @ 2:03 PM           Slit Lamp and Fundus Exam     Slit Lamp  Exam       Right Left   Lids/Lashes Dermatochalasis - upper lid Dermatochalasis - upper lid   Conjunctiva/Sclera White and quiet White and quiet   Cornea arcus, trace PEE, mild tear film debris arcus, trace PEE, mild tear film debris   Anterior Chamber Deep and quiet Deep and quiet   Iris Round and dilated Round and dilated   Lens PC IOL in good position, trace Posterior capsular opacification PC IOL in good position, 1+ Posterior capsular opacification   Anterior Vitreous Vitreous syneresis Vitreous syneresis         Fundus Exam       Right Left   Disc Pink and Sharp, Compact, temporal PPA, peripapillary CNV with edema superior disc -- slightly increased, no heme mild Pallor, Sharp rim, PPA   C/D Ratio 0.5 0.6   Macula Flat, Blunted foveal reflex, fine Drusen, RPE mottling and clumping, No heme or edema Blunted foveal reflex, central CNV with edema - increased, no frank heme, Drusen, RPE mottling   Vessels mild attenuation, mild tortuosity attenuated, Tortuous   Periphery Attached, mild reticular degeneration, No heme Attached, mild reticular degeneration, No  heme           IMAGING AND PROCEDURES  Imaging and Procedures for 10/18/2022  OCT, Retina - OU - Both Eyes       Right Eye Quality was good. Central Foveal Thickness: 294. Progression has worsened. Findings include normal foveal contour, no SRF, retinal drusen , subretinal hyper-reflective material, intraretinal fluid, pigment epithelial detachment (Peripapillary PED / CNV with mild cystic changes superior to disc - slightly increased - caught on widefield, Mild ERM).   Left Eye Quality was good. Central Foveal Thickness: 478. Progression has worsened. Findings include abnormal foveal contour, intraretinal fluid, pigment epithelial detachment, subretinal fluid (Interval increase in SRF overlying central PED).   Notes *Images captured and stored on drive  Diagnosis / Impression:  Exu ARMD OU OD: Peripapillary PED / CNV with mild cystic changes superior to disc - slightly increased - caught on widefield, Mild ERM OS: Interval increase in SRF overlying central PED  Clinical management:  See below  Abbreviations: NFP - Normal foveal profile. CME - cystoid macular edema. PED - pigment epithelial detachment. IRF - intraretinal fluid. SRF - subretinal fluid. EZ - ellipsoid zone. ERM - epiretinal membrane. ORA - outer retinal atrophy. ORT - outer retinal tubulation. SRHM - subretinal hyper-reflective material. IRHM - intraretinal hyper-reflective material      Intravitreal Injection, Pharmacologic Agent - OD - Right Eye       Time Out 10/18/2022. 2:38 PM. Confirmed correct patient, procedure, site, and patient consented.   Anesthesia Topical anesthesia was used. Anesthetic medications included Lidocaine 2%, Proparacaine 0.5%.   Procedure Preparation included 5% betadine to ocular surface, eyelid speculum. A supplied needle was used.   Injection: 1.25 mg Bevacizumab 1.'25mg'$ /0.80m   Route: Intravitreal, Site: Right Eye   NDC: 5H061816 Lot:: [id number] Expiration date:  11/28/2022   Post-op Post injection exam found visual acuity of at least counting fingers. The patient tolerated the procedure well. There were no complications. The patient received written and verbal post procedure care education. Post injection medications were not given.      Intravitreal Injection, Pharmacologic Agent - OS - Left Eye       Time Out 10/18/2022. 2:38 PM. Confirmed correct patient, procedure, site, and patient consented.   Anesthesia Topical anesthesia was used. Anesthetic medications included Lidocaine 4%, Proparacaine 0.5%.   Procedure Preparation  included 5% betadine to ocular surface, eyelid speculum. A (32g) needle was used.   Injection: 1.25 mg Bevacizumab 1.'25mg'$ /0.34m   Route: Intravitreal, Site: Left Eye   NDC: 5H061816 Lot:: [id number] Expiration date: 11/20/2022   Post-op Post injection exam found visual acuity of at least counting fingers. The patient tolerated the procedure well. There were no complications. The patient received written and verbal post procedure care education. Post injection medications were not given.            ASSESSMENT/PLAN:    ICD-10-CM   1. Exudative age-related macular degeneration of both eyes with active choroidal neovascularization ([facility])  H35.3231 OCT, Retina - OU - Both Eyes    Intravitreal Injection, Pharmacologic Agent - OD - Right Eye    Intravitreal Injection, Pharmacologic Agent - OS - Left Eye    Bevacizumab (AVASTIN) SOLN 1.25 mg    Bevacizumab (AVASTIN) SOLN 1.25 mg    2. Essential hypertension  I10     3. Hypertensive retinopathy of both eyes  H35.033     4. Pseudophakia, both eyes  Z96.1       Exudative age related macular degeneration, OU  - pt is delayed to follow up from 4 weeks to 8  - OD: small peripapillary CNV  - OS: central CNV  - s/p IVA OD #1 (09.13.23)  - s/p IVA OS #1 (08.16.23), #2 (09.13.23)  - BCVA 20/20 OD, 20/40 OS  - OCT shows OD: Peripapillary PED / CNV with mild cystic  changes superior to disc - slightly increased - caught on widefield, Mild ERM; OS: Interval increase in SRF overlying central PED at 8 wks  - FA (09.13.23) shows OD: Focal peripapillary CNV with leakage superior disc; OS: Central CNV with central blockage from heme, +leakage  - recommend IVA OU (OD #2 and OS #3) today, 11.14.23 w/ f/u back to 4 wks - pt wishes to proceed with injection - RBA of procedure discussed, questions answered - IVA informed consent obtained and signed, 09.13.23 (OU) - see procedure note  - f/u in 4 wks -- DFE/OCT/possible injection(s)  2,3. Hypertensive retinopathy OU - discussed importance of tight BP control - monitor  4. Pseudophakia OU  - s/p CE/IOL OU (Dr. HHerbert Deaner  - IOLs in good position, doing well  - monitor  Ophthalmic Meds Ordered this visit:  Meds ordered this encounter  Medications   Bevacizumab (AVASTIN) SOLN 1.25 mg   Bevacizumab (AVASTIN) SOLN 1.25 mg     Return in about 4 weeks (around 11/15/2022) for f/u exu ARMD OU, DFE, OCT.  There are no Patient Instructions on file for this visit.  Explained the diagnoses, plan, and follow up with the patient and they expressed understanding.  Patient expressed understanding of the importance of proper follow up care.   This document serves as a record of services personally performed by BGardiner Sleeper MD, PhD. It was created on their behalf by CRenaldo Reel [place] ophthalmic technician. The creation of this record is the provider's dictation and/or activities during the visit.    Electronically signed by:  CRenaldo Reel COT  10.31.23 11:06 PM  BGardiner Sleeper M.D., Ph.D. Diseases & Surgery of the Retina and Vitreous [facility] I have reviewed the above documentation for accuracy and completeness, and I agree with the above. BGardiner Sleeper M.D., Ph.D. 10/18/22 11:09 PM  Abbreviations: M myopia (nearsighted); A astigmatism; H hyperopia (farsighted); P  presbyopia; Mrx spectacle prescription;  CTL contact  lenses; OD right eye; OS left eye; OU both eyes  XT exotropia; ET esotropia; PEK punctate epithelial keratitis; PEE punctate epithelial erosions; DES dry eye syndrome; MGD meibomian gland dysfunction; ATs artificial tears; PFAT's preservative free artificial tears; [place] nuclear sclerotic cataract; PSC posterior subcapsular cataract; ERM epi-retinal membrane; PVD posterior vitreous detachment; RD retinal detachment; DM diabetes mellitus; DR diabetic retinopathy; NPDR non-proliferative diabetic retinopathy; PDR proliferative diabetic retinopathy; CSME clinically significant macular edema; DME diabetic macular edema; dbh dot blot hemorrhages; CWS cotton wool spot; POAG primary open angle glaucoma; C/D cup-to-disc ratio; HVF humphrey visual field; GVF goldmann visual field; OCT optical coherence tomography; IOP intraocular pressure; BRVO Branch retinal vein occlusion; CRVO central retinal vein occlusion; CRAO central retinal artery occlusion; BRAO branch retinal artery occlusion; RT retinal tear; SB scleral buckle; PPV pars plana vitrectomy; VH Vitreous hemorrhage; PRP panretinal laser photocoagulation; IVK intravitreal kenalog; VMT vitreomacular traction; MH Macular hole;  NVD neovascularization of the disc; NVE neovascularization elsewhere; AREDS age related eye disease study; ARMD age related macular degeneration; POAG primary open angle glaucoma; EBMD epithelial/anterior basement membrane dystrophy; ACIOL anterior chamber intraocular lens; IOL intraocular lens; PCIOL posterior chamber intraocular lens; Phaco/IOL phacoemulsification with intraocular lens placement; [place] photorefractive keratectomy; LASIK laser assisted in situ keratomileusis; HTN hypertension; DM diabetes mellitus; COPD chronic obstructive pulmonary disease

## 2022-10-17 DIAGNOSIS — X32XXXD Exposure to sunlight, subsequent encounter: Secondary | ICD-10-CM | POA: Diagnosis not present

## 2022-10-17 DIAGNOSIS — L570 Actinic keratosis: Secondary | ICD-10-CM | POA: Diagnosis not present

## 2022-10-18 ENCOUNTER — Ambulatory Visit (INDEPENDENT_AMBULATORY_CARE_PROVIDER_SITE_OTHER): Payer: Medicare Other | Admitting: Ophthalmology

## 2022-10-18 ENCOUNTER — Encounter (INDEPENDENT_AMBULATORY_CARE_PROVIDER_SITE_OTHER): Payer: Self-pay | Admitting: Ophthalmology

## 2022-10-18 DIAGNOSIS — I10 Essential (primary) hypertension: Secondary | ICD-10-CM

## 2022-10-18 DIAGNOSIS — H353231 Exudative age-related macular degeneration, bilateral, with active choroidal neovascularization: Secondary | ICD-10-CM | POA: Diagnosis not present

## 2022-10-18 DIAGNOSIS — Z961 Presence of intraocular lens: Secondary | ICD-10-CM

## 2022-10-18 DIAGNOSIS — H35033 Hypertensive retinopathy, bilateral: Secondary | ICD-10-CM | POA: Diagnosis not present

## 2022-10-18 MED ORDER — BEVACIZUMAB CHEMO INJECTION 1.25MG/0.05ML SYRINGE FOR KALEIDOSCOPE
1.2500 mg | INTRAVITREAL | Status: AC | PRN
  Administered 2022-10-18: 1.25 mg via INTRAVITREAL

## 2022-11-01 NOTE — Progress Notes (Signed)
[facility] Note  11/15/2022    CHIEF COMPLAINT Patient presents for Retina Follow Up   HISTORY OF PRESENT ILLNESS: Audrey Sosa is a 88 y.o. female who presents to the clinic today for:   HPI     Retina Follow Up   Patient presents with  Wet AMD.  Severity is mild.  Duration of 4 weeks.  Since onset it is gradually improving.  I, the attending physician,  performed the HPI with the patient and updated documentation appropriately.        Comments   4 week Retina eval for wet armd ou. Patient states vision is doing well      Last edited by Bernarda Caffey, MD on 11/16/2022  8:34 AM.      Referring physician: Wenda Low, MD [address] [place],  [place] [address]  HISTORICAL INFORMATION:   Selected notes from the MEDICAL RECORD NUMBER Referred by Dr. Lucianne Lei for ex ARMD LEE:  Ocular Hx- PMH-    CURRENT MEDICATIONS: No current outpatient medications on file. (Ophthalmic Drugs)   No current facility-administered medications for this visit. (Ophthalmic Drugs)   Current Outpatient Medications (Other)  Medication Sig   Cholecalciferol (VITAMIN D3) 1.25 MG (50000 UT) CAPS Take by mouth as directed.   Multiple Vitamins-Minerals (MULTIVITAMIN ADULT EXTRA C PO)    raloxifene (EVISTA) 60 MG tablet Take 60 mg by mouth daily.   simvastatin (ZOCOR) 20 MG tablet Take 20 mg by mouth daily.   valsartan-hydrochlorothiazide (DIOVAN-HCT) 160-12.5 MG tablet Take by mouth as directed.   No current facility-administered medications for this visit. (Other)   REVIEW OF SYSTEMS: ROS   Positive for: Eyes Negative for: Constitutional, Gastrointestinal, Neurological, Skin, Genitourinary, Musculoskeletal, HENT, Endocrine, Cardiovascular, Respiratory, Psychiatric, Allergic/Imm, Heme/Lymph Last edited by Elmore Guise, COT on 11/15/2022  1:23 PM.     ALLERGIES Not on File  PAST MEDICAL HISTORY History reviewed. No pertinent past medical  history. Past Surgical History:  Procedure Laterality Date   CATARACT EXTRACTION     FAMILY HISTORY Family History  Problem Relation Age of Onset   Macular degeneration Mother    Macular degeneration Father    SOCIAL HISTORY Social History   Tobacco Use   Smoking status: Never   Smokeless tobacco: Never       OPHTHALMIC EXAM:  Base Eye Exam     Visual Acuity (Snellen - Linear)       Right Left   Dist [place] 20/20-1 20/25-2         Tonometry (Tonopen, 1:25 PM)       Right Left   Pressure 15 15         Pupils       Dark Light Shape React APD   Right 3 2 Round Brisk None   Left 3 2 Round Brisk None         Visual Fields (Counting fingers)       Left Right    Full Full         Extraocular Movement       Right Left    Full, Ortho Full, Ortho         Neuro/Psych     Oriented x3: Yes   Mood/Affect: Normal         Dilation     Both eyes: 1.0% Mydriacyl, 2.5% Phenylephrine @ 1:25 PM           Slit Lamp and Fundus  Exam     Slit Lamp Exam       Right Left   Lids/Lashes Dermatochalasis - upper lid Dermatochalasis - upper lid   Conjunctiva/Sclera White and quiet White and quiet   Cornea arcus, trace PEE, mild tear film debris arcus, trace PEE, mild tear film debris   Anterior Chamber Deep and quiet Deep and quiet   Iris Round and dilated Round and dilated   Lens PC IOL in good position, trace Posterior capsular opacification PC IOL in good position, 1+ Posterior capsular opacification   Anterior Vitreous Vitreous syneresis Vitreous syneresis         Fundus Exam       Right Left   Disc Pink and Sharp, Compact, temporal PPA, peripapillary CNV with edema superior disc -- slightly improved, no heme mild Pallor, Sharp rim, PPA   C/D Ratio 0.5 0.6   Macula Flat, Blunted foveal reflex, fine Drusen, RPE mottling and clumping, No heme or edema Blunted foveal reflex, central CNV with edema - improved, Drusen, RPE mottling   Vessels mild  attenuation, mild tortuosity attenuated, mild tortuosity   Periphery Attached, mild reticular degeneration, No heme Attached, mild reticular degeneration, No heme           IMAGING AND PROCEDURES  Imaging and Procedures for 11/15/2022  OCT, Retina - OU - Both Eyes       Right Eye Quality was good. Central Foveal Thickness: 294. Progression has improved. Findings include normal foveal contour, no IRF, no SRF, retinal drusen , subretinal hyper-reflective material, pigment epithelial detachment (Peripapillary PED / CNV with mild interval improvement in cystic changes superior to disc -- caught on widefield, Mild ERM).   Left Eye Quality was good. Central Foveal Thickness: 341. Progression has improved. Findings include abnormal foveal contour, intraretinal fluid, pigment epithelial detachment, subretinal fluid (Interval improvement in SRF overlying central PED; improved foveal contour).   Notes *Images captured and stored on drive  Diagnosis / Impression:  Exu ARMD OU OD: Peripapillary PED / CNV with mild interval improvement in cystic changes superior to disc -- caught on widefield, Mild ERM OS: Interval improvement in SRF overlying central PED; improved foveal contour  Clinical management:  See below  Abbreviations: NFP - Normal foveal profile. CME - cystoid macular edema. PED - pigment epithelial detachment. IRF - intraretinal fluid. SRF - subretinal fluid. EZ - ellipsoid zone. ERM - epiretinal membrane. ORA - outer retinal atrophy. ORT - outer retinal tubulation. SRHM - subretinal hyper-reflective material. IRHM - intraretinal hyper-reflective material      Intravitreal Injection, Pharmacologic Agent - OD - Right Eye       Time Out 11/15/2022. 1:51 PM. Confirmed correct patient, procedure, site, and patient consented.   Anesthesia Topical anesthesia was used. Anesthetic medications included Lidocaine 2%, Proparacaine 0.5%.   Procedure Preparation included 5% betadine to  ocular surface, eyelid speculum. A supplied needle was used.   Injection: 1.25 mg Bevacizumab 1.'25mg'$ /0.36m   Route: Intravitreal, Site: Right Eye   NDC: [id number] Lot:: [id number] Expiration date: 01/04/2023   Post-op Post injection exam found visual acuity of at least counting fingers. The patient tolerated the procedure well. There were no complications. The patient received written and verbal post procedure care education. Post injection medications were not given.      Intravitreal Injection, Pharmacologic Agent - OS - Left Eye       Time Out 11/15/2022. 1:51 PM. Confirmed correct patient, procedure, site, and patient consented.   Anesthesia Topical anesthesia was used.  Anesthetic medications included Lidocaine 2%, Proparacaine 0.5%.   Procedure Preparation included 5% betadine to ocular surface, eyelid speculum. A supplied (32g) needle was used.   Injection: 1.25 mg Bevacizumab 1.'25mg'$ /0.30m   Route: Intravitreal, Site: Left Eye   NDC: 5H061816 Lot: [id number]'$ , Expiration date: 12/12/2022   Post-op Post injection exam found visual acuity of at least counting fingers. The patient tolerated the procedure well. There were no complications. The patient received written and verbal post procedure care education. Post injection medications were not given.            ASSESSMENT/PLAN:    ICD-10-CM   1. Exudative age-related macular degeneration of both eyes with active choroidal neovascularization (HCC)  H35.3231 OCT, Retina - OU - Both Eyes    Intravitreal Injection, Pharmacologic Agent - OD - Right Eye    Intravitreal Injection, Pharmacologic Agent - OS - Left Eye    Bevacizumab (AVASTIN) SOLN 1.25 mg    Bevacizumab (AVASTIN) SOLN 1.25 mg    2. Essential hypertension  I10     3. Hypertensive retinopathy of both eyes  H35.033     4. Pseudophakia, both eyes  Z96.1      Exudative age related macular degeneration, OU  - pt delayed to follow up from 4 weeks to 8 on  11.14.23  - OD: small peripapillary CNV  - OS: central CNV  - s/p IVA OD #1 (09.13.23), #2 (11.14.23)  - s/p IVA OS #1 (08.16.23), #2 (09.13.23), 33 (11.14.23)  - BCVA 20/20 OD, 20/40 OS  - OCT shows OD: Peripapillary PED / CNV with mild interval improvement in cystic changes superior to disc -- caught on widefield, Mild ERM; OS: Interval improvement in SRF overlying central PED; improved foveal contour  - FA (09.13.23) shows OD: Focal peripapillary CNV with leakage superior disc; OS: Central CNV with central blockage from heme, +leakage  - recommend IVA OU (OD #3 and OS #4) today, 12.12.23 w/ f/u at 4 wks - pt wishes to proceed with injection - RBA of procedure discussed, questions answered - IVA informed consent obtained and signed, 09.13.23 (OU) - see procedure note  - f/u in 4 wks -- DFE/OCT/possible injection(s)  2,3. Hypertensive retinopathy OU - discussed importance of tight BP control - monitor  4. Pseudophakia OU  - s/p CE/IOL OU (Dr. [id number]  - IOLs in good position, doing well  - monitor  Ophthalmic Meds Ordered this visit:  Meds ordered this encounter  Medications   Bevacizumab (AVASTIN) SOLN 1.25 mg   Bevacizumab (AVASTIN) SOLN 1.25 mg     Return in about 4 weeks (around 12/13/2022) for f/u exu ARMD OU, DFE, OCT.  There are no Patient Instructions on file for this visit.  Explained the diagnoses, plan, and follow up with the patient and they expressed understanding.  Patient expressed understanding of the importance of proper follow up care.   This document serves as a record of services personally performed by B14/08/2023 MD, PhD. It was created on their behalf by CGardiner Sleeper CNew Seaburyan ophthalmic technician. The creation of this record is the provider's dictation and/or activities during the visit.    Electronically signed by:  [id number] COT  11.28.23 8:34 AM  This document serves as a record of services personally performed by B12.11.23  MD, PhD. It was created on their behalf by AGardiner Sleeper BSan Jetty OA an ophthalmic technician. The creation of this record is the provider's dictation and/or activities during the visit.  Electronically signed by: San Jetty. Owens Shark, [place] 12.12.2023 8:34 AM  Gardiner Sleeper, M.D., Ph.D. Diseases & Surgery of the Retina and Vitreous [facility]  I have reviewed the above documentation for accuracy and completeness, and I agree with the above. Gardiner Sleeper, M.D., Ph.D. 11/16/22 8:35 AM  Abbreviations: M myopia (nearsighted); A astigmatism; H hyperopia (farsighted); P presbyopia; Mrx spectacle prescription;  CTL contact lenses; OD right eye; OS left eye; OU both eyes  XT exotropia; ET esotropia; PEK punctate epithelial keratitis; PEE punctate epithelial erosions; DES dry eye syndrome; MGD meibomian gland dysfunction; ATs artificial tears; PFAT's preservative free artificial tears; [place] nuclear sclerotic cataract; PSC posterior subcapsular cataract; ERM epi-retinal membrane; PVD posterior vitreous detachment; RD retinal detachment; DM diabetes mellitus; DR diabetic retinopathy; NPDR non-proliferative diabetic retinopathy; PDR proliferative diabetic retinopathy; CSME clinically significant macular edema; DME diabetic macular edema; dbh dot blot hemorrhages; CWS cotton wool spot; POAG primary open angle glaucoma; C/D cup-to-disc ratio; HVF humphrey visual field; GVF goldmann visual field; OCT optical coherence tomography; IOP intraocular pressure; BRVO Branch retinal vein occlusion; CRVO central retinal vein occlusion; CRAO central retinal artery occlusion; BRAO branch retinal artery occlusion; RT retinal tear; SB scleral buckle; PPV pars plana vitrectomy; VH Vitreous hemorrhage; PRP panretinal laser photocoagulation; IVK intravitreal kenalog; VMT vitreomacular traction; MH Macular hole;  NVD neovascularization of the disc; NVE neovascularization elsewhere; AREDS age related eye disease study;  ARMD age related macular degeneration; POAG primary open angle glaucoma; EBMD epithelial/anterior basement membrane dystrophy; ACIOL anterior chamber intraocular lens; IOL intraocular lens; PCIOL posterior chamber intraocular lens; Phaco/IOL phacoemulsification with intraocular lens placement; [place] photorefractive keratectomy; LASIK laser assisted in situ keratomileusis; HTN hypertension; DM diabetes mellitus; COPD chronic obstructive pulmonary disease

## 2022-11-14 DIAGNOSIS — Z08 Encounter for follow-up examination after completed treatment for malignant neoplasm: Secondary | ICD-10-CM | POA: Diagnosis not present

## 2022-11-14 DIAGNOSIS — Z85828 Personal history of other malignant neoplasm of skin: Secondary | ICD-10-CM | POA: Diagnosis not present

## 2022-11-15 ENCOUNTER — Ambulatory Visit (INDEPENDENT_AMBULATORY_CARE_PROVIDER_SITE_OTHER): Payer: Medicare Other | Admitting: Ophthalmology

## 2022-11-15 ENCOUNTER — Encounter (INDEPENDENT_AMBULATORY_CARE_PROVIDER_SITE_OTHER): Payer: Self-pay | Admitting: Ophthalmology

## 2022-11-15 DIAGNOSIS — I10 Essential (primary) hypertension: Secondary | ICD-10-CM | POA: Diagnosis not present

## 2022-11-15 DIAGNOSIS — Z961 Presence of intraocular lens: Secondary | ICD-10-CM | POA: Diagnosis not present

## 2022-11-15 DIAGNOSIS — H35033 Hypertensive retinopathy, bilateral: Secondary | ICD-10-CM | POA: Diagnosis not present

## 2022-11-15 DIAGNOSIS — H353231 Exudative age-related macular degeneration, bilateral, with active choroidal neovascularization: Secondary | ICD-10-CM

## 2022-11-15 MED ORDER — BEVACIZUMAB CHEMO INJECTION 1.25MG/0.05ML SYRINGE FOR KALEIDOSCOPE
1.2500 mg | INTRAVITREAL | Status: AC | PRN
  Administered 2022-11-15: 1.25 mg via INTRAVITREAL

## 2022-12-01 NOTE — Progress Notes (Signed)
[facility] Note  12/13/2022    CHIEF COMPLAINT Patient presents for Retina Follow Up   HISTORY OF PRESENT ILLNESS: Audrey Sosa is a 88 y.o. female who presents to the clinic today for:   HPI     Retina Follow Up   Patient presents with  Wet AMD.  In both eyes.  This started 4 weeks ago.  Duration of 4 weeks.  I, the attending physician,  performed the HPI with the patient and updated documentation appropriately.        Comments   4 week retina follow up AMD OU and IVA OU pt is reporting no vision changes noticed       Last edited by Bernarda Caffey, MD on 12/13/2022  4:35 PM.     Referring physician: Wenda Low, MD [address] [place],  [place] [address]  HISTORICAL INFORMATION:   Selected notes from the MEDICAL RECORD NUMBER Referred by Dr. Lucianne Lei for ex ARMD LEE:  Ocular Hx- PMH-    CURRENT MEDICATIONS: No current outpatient medications on file. (Ophthalmic Drugs)   No current facility-administered medications for this visit. (Ophthalmic Drugs)   Current Outpatient Medications (Other)  Medication Sig   Cholecalciferol (VITAMIN D3) 1.25 MG (50000 UT) CAPS Take by mouth as directed.   Multiple Vitamins-Minerals (MULTIVITAMIN ADULT EXTRA C PO)    raloxifene (EVISTA) 60 MG tablet Take 60 mg by mouth daily.   simvastatin (ZOCOR) 20 MG tablet Take 20 mg by mouth daily.   valsartan-hydrochlorothiazide (DIOVAN-HCT) 160-12.5 MG tablet Take by mouth as directed.   No current facility-administered medications for this visit. (Other)   REVIEW OF SYSTEMS: ROS   Positive for: Eyes Last edited by Bernarda Caffey, MD on 12/13/2022  1:50 PM.     ALLERGIES Not on File  PAST MEDICAL HISTORY History reviewed. No pertinent past medical history. Past Surgical History:  Procedure Laterality Date   CATARACT EXTRACTION     FAMILY HISTORY Family History  Problem Relation Age of Onset   Macular degeneration Mother    Macular  degeneration Father    SOCIAL HISTORY Social History   Tobacco Use   Smoking status: Never   Smokeless tobacco: Never       OPHTHALMIC EXAM:  Base Eye Exam     Visual Acuity (Snellen - Linear)       Right Left   Dist [place] 20/25 20/25 -2         Tonometry (Tonopen, 1:21 PM)       Right Left   Pressure 14 15         Pupils       Pupils Dark Light Shape React APD   Right PERRL 3 2 Round Brisk None   Left PERRL 3 2 Round Brisk None         Visual Fields       Left Right    Full Full         Extraocular Movement       Right Left    Full, Ortho Full, Ortho         Neuro/Psych     Oriented x3: Yes   Mood/Affect: Normal         Dilation     Both eyes: 2.5% Phenylephrine @ 1:21 PM           Slit Lamp and Fundus Exam     Slit Lamp Exam  Right Left   Lids/Lashes Dermatochalasis - upper lid Dermatochalasis - upper lid   Conjunctiva/Sclera White and quiet White and quiet   Cornea arcus, trace PEE, mild tear film debris arcus, trace PEE, mild tear film debris   Anterior Chamber Deep and quiet Deep and quiet   Iris Round and dilated Round and dilated   Lens PC IOL in good position, trace Posterior capsular opacification PC IOL in good position, 1+ Posterior capsular opacification   Anterior Vitreous Vitreous syneresis Vitreous syneresis         Fundus Exam       Right Left   Disc Pink and Sharp, Compact, temporal PPA, peripapillary CNV with edema superior disc -- slightly improved, no heme mild Pallor, Sharp rim, PPA   C/D Ratio 0.5 0.6   Macula Flat, Blunted foveal reflex, fine Drusen, RPE mottling and clumping, No heme or edema Blunted foveal reflex, central CNV with edema - improved, Drusen, RPE mottling, no heme   Vessels attenuated, Tortuous attenuated, Tortuous   Periphery Attached, mild reticular degeneration, No heme Attached, mild reticular degeneration, No heme           IMAGING AND PROCEDURES  Imaging and  Procedures for 12/13/2022  OCT, Retina - OU - Both Eyes       Right Eye Quality was good. Central Foveal Thickness: 291. Progression has improved. Findings include normal foveal contour, no IRF, no SRF, retinal drusen , subretinal hyper-reflective material, pigment epithelial detachment (Peripapillary PED / CNV with persistent cystic changes superior to disc -- caught on widefield, Mild ERM).   Left Eye Quality was good. Central Foveal Thickness: 331. Progression has improved. Findings include abnormal foveal contour, subretinal hyper-reflective material, intraretinal hyper-reflective material, intraretinal fluid, pigment epithelial detachment, subretinal fluid (Interval improvement in SRF overlying central PED).   Notes *Images captured and stored on drive  Diagnosis / Impression:  Exu ARMD OU OD: Peripapillary PED / CNV with persistent cystic changes superior to disc -- caught on widefield, Mild ERM OS: Interval improvement in SRF overlying central PED  Clinical management:  See below  Abbreviations: NFP - Normal foveal profile. CME - cystoid macular edema. PED - pigment epithelial detachment. IRF - intraretinal fluid. SRF - subretinal fluid. EZ - ellipsoid zone. ERM - epiretinal membrane. ORA - outer retinal atrophy. ORT - outer retinal tubulation. SRHM - subretinal hyper-reflective material. IRHM - intraretinal hyper-reflective material      Intravitreal Injection, Pharmacologic Agent - OD - Right Eye       Time Out 12/13/2022. 1:37 PM. Confirmed correct patient, procedure, site, and patient consented.   Anesthesia Topical anesthesia was used. Anesthetic medications included Lidocaine 2%, Proparacaine 0.5%.   Procedure Preparation included 5% betadine to ocular surface, eyelid speculum. A (32g) needle was used.   Injection: 1.25 mg Bevacizumab 1.'25mg'$ /0.72m   Route: Intravitreal, Site: Right Eye   NDC: [id number] Lot:: [id number] Expiration date: 01/03/2023   Post-op Post  injection exam found visual acuity of at least counting fingers. The patient tolerated the procedure well. There were no complications. The patient received written and verbal post procedure care education. Post injection medications were not given.      Intravitreal Injection, Pharmacologic Agent - OS - Left Eye       Time Out 12/13/2022. 1:37 PM. Confirmed correct patient, procedure, site, and patient consented.   Anesthesia Topical anesthesia was used. Anesthetic medications included Lidocaine 2%, Proparacaine 0.5%.   Procedure Preparation included 5% betadine to ocular surface, eyelid speculum. A (32g)  needle was used.   Injection: 1.25 mg Bevacizumab 1.'25mg'$ /0.18m   Route: Intravitreal, Site: Left Eye   NDC: 5H061816 Lot:: [id number] Expiration date: 02/03/2023   Post-op Post injection exam found visual acuity of at least counting fingers. The patient tolerated the procedure well. There were no complications. The patient received written and verbal post procedure care education. Post injection medications were not given.            ASSESSMENT/PLAN:    ICD-10-CM   1. Exudative age-related macular degeneration of both eyes with active choroidal neovascularization (HCC)  H35.3231 OCT, Retina - OU - Both Eyes    Intravitreal Injection, Pharmacologic Agent - OD - Right Eye    Intravitreal Injection, Pharmacologic Agent - OS - Left Eye    Bevacizumab (AVASTIN) SOLN 1.25 mg    Bevacizumab (AVASTIN) SOLN 1.25 mg    2. Essential hypertension  I10     3. Hypertensive retinopathy of both eyes  H35.033     4. Pseudophakia, both eyes  Z96.1      Exudative age related macular degeneration, OU  - pt delayed to follow up from 4 weeks to 8 on 11.14.23  - OD: small peripapillary CNV  - OS: central CNV  - s/p IVA OD #1 (09.13.23), #2 (11.14.23), #3 (12.12.23)  - s/p IVA OS #1 (08.16.23), #2 (09.13.23), #3 (11.14.23), #4 (12.12.23)  - BCVA 20/20 OD, 20/40 OS  - OCT shows OD:  Peripapillary PED / CNV with persistent cystic changes superior to disc -- caught on widefield, Mild ERM; OS: Interval improvement in SRF overlying central PED  - FA (09.13.23) shows OD: Focal peripapillary CNV with leakage superior disc; OS: Central CNV with central blockage from heme, +leakage  - recommend IVA OU (OD #4 and OS #5) today, 1.09.23 w/ f/u at 4 wks - pt wishes to proceed with injection - RBA of procedure discussed, questions answered - IVA informed consent obtained and signed, 09.13.23 (OU) - see procedure note - discussed possible IVA resistance -- will check Eylea auth for next visit  - f/u in 4 wks -- DFE/OCT/possible injection(s)  2,3. Hypertensive retinopathy OU - discussed importance of tight BP control - monitor  4. Pseudophakia OU  - s/p CE/IOL OU (Dr. HHerbert Deaner  - IOLs in good position, doing well  - monitor  Ophthalmic Meds Ordered this visit:  Meds ordered this encounter  Medications   Bevacizumab (AVASTIN) SOLN 1.25 mg   Bevacizumab (AVASTIN) SOLN 1.25 mg     Return in about 4 weeks (around 01/10/2023) for f/u exu ARMD OU, DFE, OCT, Possible Injxn.  There are no Patient Instructions on file for this visit.  Explained the diagnoses, plan, and follow up with the patient and they expressed understanding.  Patient expressed understanding of the importance of proper follow up care.   This document serves as a record of services personally performed by BGardiner Sleeper MD, PhD. It was created on their behalf by CRenaldo Reel CW[place]an ophthalmic technician. The creation of this record is the provider's dictation and/or activities during the visit.    Electronically signed by:  CRenaldo Reel COT 12.28.23 4:35 PM  This document serves as a record of services personally performed by BGardiner Sleeper MD, PhD. It was created on their behalf by ASan Jetty BOwens Shark OA an ophthalmic technician. The creation of this record is the provider's dictation and/or  activities during the visit.    Electronically signed by: ASan Jetty BOwens Shark O[place]01.09.2024 4:35 PM  Gardiner Sleeper, M.D., Ph.D. Diseases & Surgery of the Retina and Vitreous [facility]  I have reviewed the above documentation for accuracy and completeness, and I agree with the above. Gardiner Sleeper, M.D., Ph.D. 12/13/22 4:37 PM   Abbreviations: M myopia (nearsighted); A astigmatism; H hyperopia (farsighted); P presbyopia; Mrx spectacle prescription;  CTL contact lenses; OD right eye; OS left eye; OU both eyes  XT exotropia; ET esotropia; PEK punctate epithelial keratitis; PEE punctate epithelial erosions; DES dry eye syndrome; MGD meibomian gland dysfunction; ATs artificial tears; PFAT's preservative free artificial tears; [place] nuclear sclerotic cataract; PSC posterior subcapsular cataract; ERM epi-retinal membrane; PVD posterior vitreous detachment; RD retinal detachment; DM diabetes mellitus; DR diabetic retinopathy; NPDR non-proliferative diabetic retinopathy; PDR proliferative diabetic retinopathy; CSME clinically significant macular edema; DME diabetic macular edema; dbh dot blot hemorrhages; CWS cotton wool spot; POAG primary open angle glaucoma; C/D cup-to-disc ratio; HVF humphrey visual field; GVF goldmann visual field; OCT optical coherence tomography; IOP intraocular pressure; BRVO Branch retinal vein occlusion; CRVO central retinal vein occlusion; CRAO central retinal artery occlusion; BRAO branch retinal artery occlusion; RT retinal tear; SB scleral buckle; PPV pars plana vitrectomy; VH Vitreous hemorrhage; PRP panretinal laser photocoagulation; IVK intravitreal kenalog; VMT vitreomacular traction; MH Macular hole;  NVD neovascularization of the disc; NVE neovascularization elsewhere; AREDS age related eye disease study; ARMD age related macular degeneration; POAG primary open angle glaucoma; EBMD epithelial/anterior basement membrane dystrophy; ACIOL anterior chamber  intraocular lens; IOL intraocular lens; PCIOL posterior chamber intraocular lens; Phaco/IOL phacoemulsification with intraocular lens placement; [place] photorefractive keratectomy; LASIK laser assisted in situ keratomileusis; HTN hypertension; DM diabetes mellitus; COPD chronic obstructive pulmonary disease

## 2022-12-13 ENCOUNTER — Encounter (INDEPENDENT_AMBULATORY_CARE_PROVIDER_SITE_OTHER): Payer: Self-pay | Admitting: Ophthalmology

## 2022-12-13 ENCOUNTER — Ambulatory Visit (INDEPENDENT_AMBULATORY_CARE_PROVIDER_SITE_OTHER): Payer: Medicare Other | Admitting: Ophthalmology

## 2022-12-13 DIAGNOSIS — H35033 Hypertensive retinopathy, bilateral: Secondary | ICD-10-CM

## 2022-12-13 DIAGNOSIS — H353231 Exudative age-related macular degeneration, bilateral, with active choroidal neovascularization: Secondary | ICD-10-CM

## 2022-12-13 DIAGNOSIS — Z961 Presence of intraocular lens: Secondary | ICD-10-CM

## 2022-12-13 DIAGNOSIS — I10 Essential (primary) hypertension: Secondary | ICD-10-CM | POA: Diagnosis not present

## 2022-12-13 MED ORDER — BEVACIZUMAB CHEMO INJECTION 1.25MG/0.05ML SYRINGE FOR KALEIDOSCOPE
1.2500 mg | INTRAVITREAL | Status: AC | PRN
  Administered 2022-12-13: 1.25 mg via INTRAVITREAL

## 2022-12-27 NOTE — Progress Notes (Signed)
[facility] Note  01/10/2023    CHIEF COMPLAINT Patient presents for Retina Follow Up   HISTORY OF PRESENT ILLNESS: Audrey Sosa is a 89 y.o. female who presents to the clinic today for:   HPI     Retina Follow Up   Patient presents with  Wet AMD.  In both eyes.  Severity is moderate.  Duration of 4 weeks.  Since onset it is gradually improving.  I, the attending physician,  performed the HPI with the patient and updated documentation appropriately.        Comments   Pt here for 4 wk ret f/u for exu ARMD OU. Pt states VA may be improving, lines dont seem as crooked in OS.       Last edited by Bernarda Caffey, MD on 01/10/2023  1:31 PM.    Pt states vision is the same  Referring physician: Wenda Low, [place] [address] [place],  [place] [address]  HISTORICAL INFORMATION:   Selected notes from the MEDICAL RECORD NUMBER Referred by Dr. Lucianne Lei for ex ARMD LEE:  Ocular Hx- PMH-    CURRENT MEDICATIONS: No current outpatient medications on file. (Ophthalmic Drugs)   No current facility-administered medications for this visit. (Ophthalmic Drugs)   Current Outpatient Medications (Other)  Medication Sig   Cholecalciferol (VITAMIN D3) 1.25 MG (50000 UT) CAPS Take by mouth as directed.   Multiple Vitamins-Minerals (MULTIVITAMIN ADULT EXTRA C PO)    raloxifene (EVISTA) 60 MG tablet Take 60 mg by mouth daily.   simvastatin (ZOCOR) 20 MG tablet Take 20 mg by mouth daily.   valsartan-hydrochlorothiazide (DIOVAN-HCT) 160-12.5 MG tablet Take by mouth as directed.   No current facility-administered medications for this visit. (Other)   REVIEW OF SYSTEMS: ROS   Positive for: Eyes Negative for: Constitutional, Gastrointestinal, Neurological, Skin, Genitourinary, Musculoskeletal, HENT, Endocrine, Cardiovascular, Respiratory, Psychiatric, Allergic/Imm, Heme/Lymph Last edited by Kingsley Spittle, COT on 01/10/2023 12:59 PM.      ALLERGIES Not on File  PAST MEDICAL HISTORY History reviewed. No pertinent past medical history. Past Surgical History:  Procedure Laterality Date   CATARACT EXTRACTION     FAMILY HISTORY Family History  Problem Relation Age of Onset   Macular degeneration Mother    Macular degeneration Father    SOCIAL HISTORY Social History   Tobacco Use   Smoking status: Never   Smokeless tobacco: Never       OPHTHALMIC EXAM:  Base Eye Exam     Visual Acuity (Snellen - Linear)       Right Left   Dist [place] 20/20 20/40   Dist ph   NI         Tonometry (Tonopen, 1:06 PM)       Right Left   Pressure 15 10         Pupils       Pupils Dark Light Shape React APD   Right PERRL 3 2 Round Brisk None   Left PERRL 3 2 Round Brisk None         Visual Fields (Counting fingers)       Left Right    Full Full         Extraocular Movement       Right Left    Full, Ortho Full, Ortho         Neuro/Psych     Oriented x3: Yes   Mood/Affect: Normal  Dilation     Both eyes: 1.0% Mydriacyl, 2.5% Phenylephrine @ 1:07 PM           Slit Lamp and Fundus Exam     Slit Lamp Exam       Right Left   Lids/Lashes Dermatochalasis - upper lid Dermatochalasis - upper lid   Conjunctiva/Sclera White and quiet White and quiet   Cornea arcus, trace PEE, mild tear film debris arcus, trace PEE, mild tear film debris   Anterior Chamber Deep and quiet Deep and quiet   Iris Round and dilated Round and dilated   Lens PC IOL in good position, trace Posterior capsular opacification PC IOL in good position, 1+ Posterior capsular opacification   Anterior Vitreous Vitreous syneresis Vitreous syneresis         Fundus Exam       Right Left   Disc Pink and Sharp, Compact, temporal PPA, peripapillary CNV with edema superior disc -- slightly improved, no heme mild Pallor, Sharp rim, PPA   C/D Ratio 0.5 0.6   Macula Flat, Blunted foveal reflex, fine Drusen, RPE mottling  and clumping, No heme or edema Blunted foveal reflex, central CNV with edema - slightly improved, Drusen, RPE mottling, no heme   Vessels attenuated, Tortuous attenuated, Tortuous   Periphery Attached, mild reticular degeneration, No heme Attached, mild reticular degeneration, No heme           Refraction     Manifest Refraction       Sphere Cylinder Axis Dist VA   Right       Left -1.75 +1.00 160 20/30-1           IMAGING AND PROCEDURES  Imaging and Procedures for 01/10/2023  OCT, Retina - OU - Both Eyes       Right Eye Quality was good. Central Foveal Thickness: 291. Progression has improved. Findings include normal foveal contour, no IRF, no SRF, retinal drusen , subretinal hyper-reflective material, pigment epithelial detachment (Interval improvement in peripapillary IRF overlying stable PED, Mild ERM).   Left Eye Quality was good. Central Foveal Thickness: 321. Progression has improved. Findings include abnormal foveal contour, subretinal hyper-reflective material, intraretinal hyper-reflective material, intraretinal fluid, pigment epithelial detachment, subretinal fluid (Mild interval improvement in SRF and IRHM overlying large, persistent central PED).   Notes *Images captured and stored on drive  Diagnosis / Impression:  Exu ARMD OU OD: Interval improvement in peripapillary IRF overlying stable PED, Mild ERM OS: Mild interval improvement in SRF and IRHM overlying large, persistent central PED  Clinical management:  See below  Abbreviations: NFP - Normal foveal profile. CME - cystoid macular edema. PED - pigment epithelial detachment. IRF - intraretinal fluid. SRF - subretinal fluid. EZ - ellipsoid zone. ERM - epiretinal membrane. ORA - outer retinal atrophy. ORT - outer retinal tubulation. SRHM - subretinal hyper-reflective material. IRHM - intraretinal hyper-reflective material      Intravitreal Injection, Pharmacologic Agent - OD - Right Eye       Time  Out 01/10/2023. 1:02 PM. Confirmed correct patient, procedure, site, and patient consented.   Anesthesia Topical anesthesia was used. Anesthetic medications included Lidocaine 2%, Proparacaine 0.5%.   Procedure Preparation included 5% betadine to ocular surface, eyelid speculum. A (32g) needle was used.   Injection: 1.25 mg Bevacizumab 1.'25mg'$ /0.70m   Route: Intravitreal, Site: Right Eye   NDC: [id number] Lot: [id number] , Expiration date: 02/17/2023   Post-op Post injection exam found visual acuity of at least counting fingers. The patient tolerated the procedure well.  There were no complications. The patient received written and verbal post procedure care education. Post injection medications were not given.      Intravitreal Injection, Pharmacologic Agent - OS - Left Eye       Time Out 01/10/2023. 1:02 PM. Confirmed correct patient, procedure, site, and patient consented.   Anesthesia Topical anesthesia was used. Anesthetic medications included Lidocaine 2%, Proparacaine 0.5%.   Procedure Preparation included 5% betadine to ocular surface, eyelid speculum. A (32g) needle was used.   Injection: 2 mg aflibercept 2 MG/0.05ML   Route: Intravitreal, Site: Left Eye   NDC: [id number], Lot: [id number], Expiration date: 02/02/2024, Waste: 0 mL   Post-op Post injection exam found visual acuity of at least counting fingers. The patient tolerated the procedure well. There were no complications. The patient received written and verbal post procedure care education. Post injection medications were not given.            ASSESSMENT/PLAN:    ICD-10-CM   1. Exudative age-related macular degeneration of both eyes with active choroidal neovascularization (HCC)  H35.3231 OCT, Retina - OU - Both Eyes    Intravitreal Injection, Pharmacologic Agent - OD - Right Eye    Intravitreal Injection, Pharmacologic Agent - OS - Left Eye    aflibercept (EYLEA) SOLN 2 mg    Bevacizumab (AVASTIN) SOLN  1.25 mg    2. Essential hypertension  I10     3. Hypertensive retinopathy of both eyes  H35.033     4. Pseudophakia, both eyes  Z96.1      Exudative age related macular degeneration, OU  - pt delayed to follow up from 4 weeks to 8 on 11.14.23  - OD: small peripapillary CNV  - OS: central CNV  - s/p IVA OD #1 (09.13.23), #2 (11.14.23), #3 (12.12.23), #4 (01.09.24)  - s/p IVA OS #1 (08.16.23), #2 (09.13.23), #3 (11.14.23), #4 (12.12.23), #5 (01.09.24)  - BCVA 20/20 OD, 20/40 OS  - OCT shows OD: Interval improvement in peripapillary IRF overlying stable PED, Mild ERM; OS: Mild interval improvement in SRF and IRHM overlying large, persistent central PED at 4 wks  - FA (09.13.23) shows OD: Focal peripapillary CNV with leakage superior disc; OS: Central CNV with central blockage from heme, +leakage  - discussed IVA resistance and potential benefit of switching medication  - recommend IVA OD #5 and switching to IVE OD #1 today, 02.06.24 w/ f/u in 5 wks - pt wishes to proceed with injection - RBA of procedure discussed, questions answered - IVA informed consent obtained and signed, 09.13.23 (OU) - IVE informed consent obtained and signed, 02.06.24 (OU) - see procedure note - approved for Eylea   - f/u in 5 wks -- DFE/OCT/possible injection(s)  2,3. Hypertensive retinopathy OU - discussed importance of tight BP control - monitor  4. Pseudophakia OU  - s/p CE/IOL OU (Dr. Herbert Deaner)  - IOLs in good position, doing well  - monitor  Ophthalmic Meds Ordered this visit:  Meds ordered this encounter  Medications   aflibercept (EYLEA) SOLN 2 mg   Bevacizumab (AVASTIN) SOLN 1.25 mg     Return in 5 weeks (on 02/14/2023) for f/u 5 weeks, exu ARMD OU, DFE, OCT.  There are no Patient Instructions on file for this visit.  Explained the diagnoses, plan, and follow up with the patient and they expressed understanding.  Patient expressed understanding of the importance of proper follow up care.    This document serves as a record of services personally performed by  Gardiner Sleeper, MD, PhD. It was created on their behalf by Renaldo Reel, [place] an ophthalmic technician. The creation of this record is the provider's dictation and/or activities during the visit.    Electronically signed by:  Renaldo Reel, COT 1.23.24 5:35 PM  This document serves as a record of services personally performed by Gardiner Sleeper, MD, PhD. It was created on their behalf by San Jetty. Owens Shark, OA an ophthalmic technician. The creation of this record is the provider's dictation and/or activities during the visit.    Electronically signed by: San Jetty. Owens Shark, [place] 02.06.2024 5:35 PM  Gardiner Sleeper, M.D., Ph.D. Diseases & Surgery of the Retina and Vitreous [facility]  I have reviewed the above documentation for accuracy and completeness, and I agree with the above. Gardiner Sleeper, M.D., Ph.D. 01/10/23 5:39 PM   Abbreviations: M myopia (nearsighted); A astigmatism; H hyperopia (farsighted); P presbyopia; Mrx spectacle prescription;  CTL contact lenses; OD right eye; OS left eye; OU both eyes  XT exotropia; ET esotropia; PEK punctate epithelial keratitis; PEE punctate epithelial erosions; DES dry eye syndrome; MGD meibomian gland dysfunction; ATs artificial tears; PFAT's preservative free artificial tears; [place] nuclear sclerotic cataract; PSC posterior subcapsular cataract; ERM epi-retinal membrane; PVD posterior vitreous detachment; RD retinal detachment; DM diabetes mellitus; DR diabetic retinopathy; NPDR non-proliferative diabetic retinopathy; PDR proliferative diabetic retinopathy; CSME clinically significant macular edema; DME diabetic macular edema; dbh dot blot hemorrhages; CWS cotton wool spot; POAG primary open angle glaucoma; C/D cup-to-disc ratio; HVF humphrey visual field; GVF goldmann visual field; OCT optical coherence tomography; IOP intraocular pressure; BRVO Branch  retinal vein occlusion; CRVO central retinal vein occlusion; CRAO central retinal artery occlusion; BRAO branch retinal artery occlusion; RT retinal tear; SB scleral buckle; PPV pars plana vitrectomy; VH Vitreous hemorrhage; PRP panretinal laser photocoagulation; IVK intravitreal kenalog; VMT vitreomacular traction; MH Macular hole;  NVD neovascularization of the disc; NVE neovascularization elsewhere; AREDS age related eye disease study; ARMD age related macular degeneration; POAG primary open angle glaucoma; EBMD epithelial/anterior basement membrane dystrophy; ACIOL anterior chamber intraocular lens; IOL intraocular lens; PCIOL posterior chamber intraocular lens; Phaco/IOL phacoemulsification with intraocular lens placement; [place] photorefractive keratectomy; LASIK laser assisted in situ keratomileusis; HTN hypertension; DM diabetes mellitus; COPD chronic obstructive pulmonary disease

## 2023-01-10 ENCOUNTER — Ambulatory Visit (INDEPENDENT_AMBULATORY_CARE_PROVIDER_SITE_OTHER): Payer: Medicare Other | Admitting: Ophthalmology

## 2023-01-10 ENCOUNTER — Encounter (INDEPENDENT_AMBULATORY_CARE_PROVIDER_SITE_OTHER): Payer: Self-pay | Admitting: Ophthalmology

## 2023-01-10 DIAGNOSIS — H35033 Hypertensive retinopathy, bilateral: Secondary | ICD-10-CM

## 2023-01-10 DIAGNOSIS — I10 Essential (primary) hypertension: Secondary | ICD-10-CM | POA: Diagnosis not present

## 2023-01-10 DIAGNOSIS — Z961 Presence of intraocular lens: Secondary | ICD-10-CM

## 2023-01-10 DIAGNOSIS — H353231 Exudative age-related macular degeneration, bilateral, with active choroidal neovascularization: Secondary | ICD-10-CM | POA: Diagnosis not present

## 2023-01-10 MED ORDER — BEVACIZUMAB CHEMO INJECTION 1.25MG/0.05ML SYRINGE FOR KALEIDOSCOPE
1.2500 mg | INTRAVITREAL | Status: AC | PRN
  Administered 2023-01-10: 1.25 mg via INTRAVITREAL

## 2023-01-10 MED ORDER — AFLIBERCEPT 2MG/0.05ML IZ SOLN FOR KALEIDOSCOPE
2.0000 mg | INTRAVITREAL | Status: AC | PRN
  Administered 2023-01-10: 2 mg via INTRAVITREAL

## 2023-01-31 DIAGNOSIS — I10 Essential (primary) hypertension: Secondary | ICD-10-CM | POA: Diagnosis not present

## 2023-01-31 DIAGNOSIS — R54 Age-related physical debility: Secondary | ICD-10-CM | POA: Diagnosis not present

## 2023-01-31 DIAGNOSIS — M810 Age-related osteoporosis without current pathological fracture: Secondary | ICD-10-CM | POA: Diagnosis not present

## 2023-01-31 DIAGNOSIS — E782 Mixed hyperlipidemia: Secondary | ICD-10-CM | POA: Diagnosis not present

## 2023-01-31 NOTE — Progress Notes (Signed)
[facility] Note  02/14/2023    CHIEF COMPLAINT Patient presents for Retina Follow Up   HISTORY OF PRESENT ILLNESS: Audrey Sosa is a 89 y.o. female who presents to the clinic today for:   HPI     Retina Follow Up   Patient presents with  Wet AMD.  In both eyes.  Severity is moderate.  Duration of 5 weeks.  Since onset it is gradually improving.  I, the attending physician,  performed the HPI with the patient and updated documentation appropriately.        Comments   Patient feels that the vision is the same. She is not using any eye drops at this time.       Last edited by Bernarda Caffey, MD on 02/14/2023  4:36 PM.     Pt states vision is the same  Referring physician: Wenda Low, [place] [address],  [place] [address]  HISTORICAL INFORMATION:   Selected notes from the MEDICAL RECORD NUMBER Referred by Dr. Lucianne Lei for ex ARMD LEE:  Ocular Hx- PMH-    CURRENT MEDICATIONS: No current outpatient medications on file. (Ophthalmic Drugs)   No current facility-administered medications for this visit. (Ophthalmic Drugs)   Current Outpatient Medications (Other)  Medication Sig   Cholecalciferol (VITAMIN D3) 1.25 MG (50000 UT) CAPS Take by mouth as directed.   Multiple Vitamins-Minerals (MULTIVITAMIN ADULT EXTRA C PO)    raloxifene (EVISTA) 60 MG tablet Take 60 mg by mouth daily.   simvastatin (ZOCOR) 20 MG tablet Take 20 mg by mouth daily.   valsartan-hydrochlorothiazide (DIOVAN-HCT) 160-12.5 MG tablet Take by mouth as directed.   No current facility-administered medications for this visit. (Other)   REVIEW OF SYSTEMS: ROS   Positive for: Eyes Negative for: Constitutional, Gastrointestinal, Neurological, Skin, Genitourinary, Musculoskeletal, HENT, Endocrine, Cardiovascular, Respiratory, Psychiatric, Allergic/Imm, Heme/Lymph Last edited by Annie Paras, COT on 02/14/2023  1:05 PM.      ALLERGIES Not on  File  PAST MEDICAL HISTORY History reviewed. No pertinent past medical history. Past Surgical History:  Procedure Laterality Date   CATARACT EXTRACTION     FAMILY HISTORY Family History  Problem Relation Age of Onset   Macular degeneration Mother    Macular degeneration Father    SOCIAL HISTORY Social History   Tobacco Use   Smoking status: Never   Smokeless tobacco: Never       OPHTHALMIC EXAM:  Base Eye Exam     Visual Acuity (Snellen - Linear)       Right Left   Dist [place] 20/20 20/40   Dist ph [place]  NI         Tonometry (Tonopen, 1:07 PM)       Right Left   Pressure 19 15         Pupils       Dark Light Shape React APD   Right 3 2 Round Brisk None   Left 3 2 Round Brisk None         Visual Fields       Left Right    Full Full         Extraocular Movement       Right Left    Full, Ortho Full, Ortho         Neuro/Psych     Oriented x3: Yes   Mood/Affect: Normal         Dilation     Both eyes:  1.0% Mydriacyl, 2.5% Phenylephrine @ 1:05 PM           Slit Lamp and Fundus Exam     Slit Lamp Exam       Right Left   Lids/Lashes Dermatochalasis - upper lid Dermatochalasis - upper lid   Conjunctiva/Sclera White and quiet White and quiet   Cornea arcus, trace PEE, mild tear film debris arcus, trace PEE, mild tear film debris   Anterior Chamber Deep and quiet Deep and quiet   Iris Round and dilated Round and dilated   Lens PC IOL in good position, trace Posterior capsular opacification PC IOL in good position, 1+ Posterior capsular opacification   Anterior Vitreous Vitreous syneresis Vitreous syneresis         Fundus Exam       Right Left   Disc Pink and Sharp, Compact, temporal PPA, peripapillary CNV with edema superior disc -- stably improved, no heme mild Pallor, Sharp rim, PPA   C/D Ratio 0.5 0.6   Macula Flat, Blunted foveal reflex, fine Drusen, RPE mottling and clumping, No heme or edema Blunted foveal reflex, central  CNV with edema - slightly improved, Drusen, RPE mottling, no heme, central PED collapsing   Vessels attenuated, Tortuous attenuated, Tortuous   Periphery Attached, mild reticular degeneration, No heme Attached, mild reticular degeneration, No heme           IMAGING AND PROCEDURES  Imaging and Procedures for 02/14/2023  OCT, Retina - OU - Both Eyes       Right Eye Quality was good. Central Foveal Thickness: 309. Progression has been stable. Findings include normal foveal contour, no IRF, no SRF, retinal drusen , subretinal hyper-reflective material, pigment epithelial detachment (stable improvement in peripapillary IRF overlying stable low PED, Mild ERM).   Left Eye Quality was good. Central Foveal Thickness: 342. Progression has improved. Findings include abnormal foveal contour, subretinal hyper-reflective material, intraretinal hyper-reflective material, intraretinal fluid, pigment epithelial detachment, subretinal fluid (interval improvement in central IRF, IRHM and central PED).   Notes *Images captured and stored on drive  Diagnosis / Impression:  Exu ARMD OU OD: stable improvement in peripapillary IRF overlying stable low PED, Mild ERM OS: interval improvement in central IRF, IRHM and central PED  Clinical management:  See below  Abbreviations: NFP - Normal foveal profile. CME - cystoid macular edema. PED - pigment epithelial detachment. IRF - intraretinal fluid. SRF - subretinal fluid. EZ - ellipsoid zone. ERM - epiretinal membrane. ORA - outer retinal atrophy. ORT - outer retinal tubulation. SRHM - subretinal hyper-reflective material. IRHM - intraretinal hyper-reflective material      Intravitreal Injection, Pharmacologic Agent - OS - Left Eye       Time Out 02/14/2023. 1:37 PM. Confirmed correct patient, procedure, site, and patient consented.   Anesthesia Topical anesthesia was used. Anesthetic medications included Lidocaine 2%, Proparacaine 0.5%.    Procedure Preparation included 5% betadine to ocular surface, eyelid speculum. A (32g) needle was used.   Injection: 2 mg aflibercept 2 MG/0.05ML   Route: Intravitreal, Site: Left Eye   NDC: [id number], Lot: [id number], Expiration date: 10/05/2023, Waste: 0 mL   Post-op Post injection exam found visual acuity of at least counting fingers. The patient tolerated the procedure well. There were no complications. The patient received written and verbal post procedure care education. Post injection medications were not given.            ASSESSMENT/PLAN:    ICD-10-CM   1. Exudative age-related macular degeneration of both  eyes with active choroidal neovascularization (HCC)  H35.3231 OCT, Retina - OU - Both Eyes    Intravitreal Injection, Pharmacologic Agent - OS - Left Eye    aflibercept (EYLEA) SOLN 2 mg    2. Essential hypertension  I10     3. Hypertensive retinopathy of both eyes  H35.033     4. Pseudophakia, both eyes  Z96.1      Exudative age related macular degeneration, OU  - pt delayed to follow up from 4 weeks to 8 on 11.14.23  - OD: small peripapillary CNV  - OS: central CNV  - FA (09.13.23) shows OD: Focal peripapillary CNV with leakage superior disc; OS: Central CNV with central blockage from heme, +leakage  - s/p IVA OD #1 (09.13.23), #2 (11.14.23), #3 (12.12.23), #4 (01.09.24), #5 (02.06.24)  - s/p IVA OS #1 (08.16.23), #2 (09.13.23), #3 (11.14.23), #4 (12.12.23), #5 (01.09.24) -- IVA resistance  - s/p IVE OS #1 (02.06.24)   - BCVA 20/20 OD, 20/40 OS -- stable OU  - OCT shows OD: stable improvement in peripapillary IRF overlying stable low PED, Mild ERM OS: interval improvement in central IRF, IRHM and central PED at 5 weeks  - recommend IVE OS #2 today, 03.12.24 w/ f/u in 5 wks -- will hold off on OD today - pt wishes to proceed with injection - RBA of procedure discussed, questions answered - IVA informed consent obtained and signed, 09.13.23 (OU) - IVE  informed consent obtained and signed, 02.06.24 (OU) - see procedure note - approved for Eylea   - f/u in 5 wks -- DFE/OCT/possible injection(s)  2,3. Hypertensive retinopathy OU - discussed importance of tight BP control - continue to monitor  4. Pseudophakia OU  - s/p CE/IOL OU (Dr. Herbert Deaner)  - IOLs in good position, doing well  - continue to monitor  Ophthalmic Meds Ordered this visit:  Meds ordered this encounter  Medications   aflibercept (EYLEA) SOLN 2 mg     Return in about 5 weeks (around 03/21/2023) for f/u exu ARMD OU, DFE, OCT.  There are no Patient Instructions on file for this visit.  Explained the diagnoses, plan, and follow up with the patient and they expressed understanding.  Patient expressed understanding of the importance of proper follow up care.   This document serves as a record of services personally performed by Gardiner Sleeper, MD, PhD. It was created on their behalf by Renaldo Reel, [place] an ophthalmic technician. The creation of this record is the provider's dictation and/or activities during the visit.    Electronically signed by:  Renaldo Reel, COT 02.27.24 4:45 PM  This document serves as a record of services personally performed by Gardiner Sleeper, MD, PhD. It was created on their behalf by San Jetty. Owens Shark, OA an ophthalmic technician. The creation of this record is the provider's dictation and/or activities during the visit.    Electronically signed by: San Jetty. Owens Shark, [place] 03.12.2024 4:45 PM  Gardiner Sleeper, M.D., Ph.D. Diseases & Surgery of the Retina and Vitreous [facility]  I have reviewed the above documentation for accuracy and completeness, and I agree with the above. Gardiner Sleeper, M.D., Ph.D. 02/14/23 4:47 PM  Abbreviations: M myopia (nearsighted); A astigmatism; H hyperopia (farsighted); P presbyopia; Mrx spectacle prescription;  CTL contact lenses; OD right eye; OS left eye; OU both eyes  XT  exotropia; ET esotropia; PEK punctate epithelial keratitis; PEE punctate epithelial erosions; DES dry eye syndrome; MGD meibomian gland  dysfunction; ATs artificial tears; PFAT's preservative free artificial tears; [place] nuclear sclerotic cataract; PSC posterior subcapsular cataract; ERM epi-retinal membrane; PVD posterior vitreous detachment; RD retinal detachment; DM diabetes mellitus; DR diabetic retinopathy; NPDR non-proliferative diabetic retinopathy; PDR proliferative diabetic retinopathy; CSME clinically significant macular edema; DME diabetic macular edema; dbh dot blot hemorrhages; CWS cotton wool spot; POAG primary open angle glaucoma; C/D cup-to-disc ratio; HVF humphrey visual field; GVF goldmann visual field; OCT optical coherence tomography; IOP intraocular pressure; BRVO Branch retinal vein occlusion; CRVO central retinal vein occlusion; CRAO central retinal artery occlusion; BRAO branch retinal artery occlusion; RT retinal tear; SB scleral buckle; PPV pars plana vitrectomy; VH Vitreous hemorrhage; PRP panretinal laser photocoagulation; IVK intravitreal kenalog; VMT vitreomacular traction; MH Macular hole;  NVD neovascularization of the disc; NVE neovascularization elsewhere; AREDS age related eye disease study; ARMD age related macular degeneration; POAG primary open angle glaucoma; EBMD epithelial/anterior basement membrane dystrophy; ACIOL anterior chamber intraocular lens; IOL intraocular lens; PCIOL posterior chamber intraocular lens; Phaco/IOL phacoemulsification with intraocular lens placement; [place] photorefractive keratectomy; LASIK laser assisted in situ keratomileusis; HTN hypertension; DM diabetes mellitus; COPD chronic obstructive pulmonary disease

## 2023-02-14 ENCOUNTER — Encounter (INDEPENDENT_AMBULATORY_CARE_PROVIDER_SITE_OTHER): Payer: Self-pay | Admitting: Ophthalmology

## 2023-02-14 ENCOUNTER — Ambulatory Visit (INDEPENDENT_AMBULATORY_CARE_PROVIDER_SITE_OTHER): Payer: Medicare Other | Admitting: Ophthalmology

## 2023-02-14 DIAGNOSIS — I10 Essential (primary) hypertension: Secondary | ICD-10-CM | POA: Diagnosis not present

## 2023-02-14 DIAGNOSIS — H35033 Hypertensive retinopathy, bilateral: Secondary | ICD-10-CM | POA: Diagnosis not present

## 2023-02-14 DIAGNOSIS — Z961 Presence of intraocular lens: Secondary | ICD-10-CM

## 2023-02-14 DIAGNOSIS — H353231 Exudative age-related macular degeneration, bilateral, with active choroidal neovascularization: Secondary | ICD-10-CM

## 2023-02-14 MED ORDER — AFLIBERCEPT 2MG/0.05ML IZ SOLN FOR KALEIDOSCOPE
2.0000 mg | INTRAVITREAL | Status: AC | PRN
  Administered 2023-02-14: 2 mg via INTRAVITREAL

## 2023-03-07 NOTE — Progress Notes (Signed)
[facility] - Clinic Note  03/21/2023    CHIEF COMPLAINT Patient presents for Retina Follow Up   HISTORY OF PRESENT ILLNESS: Audrey Sosa is a 89 y.o. female who presents to the clinic today for:   HPI     Retina Follow Up   Patient presents with  Wet AMD.  In both eyes.  Severity is moderate.  Duration of 5 weeks.  Since onset it is gradually improving.  I, the attending physician,  performed the HPI with the patient and updated documentation appropriately.        Comments   Patient feels that the vision is the same. She is not using any eye drops at this time.       Last edited by Rennis Chris, MD on 03/21/2023  8:22 PM.    Pt states vision is the same  Referring physician: Georgann Housekeeper, MD [address] [place],  [place] [address]  HISTORICAL INFORMATION:   Selected notes from the MEDICAL RECORD NUMBER Referred by Dr. Zenaida Niece for ex ARMD LEE:  Ocular Hx- PMH-    CURRENT MEDICATIONS: No current outpatient medications on file. (Ophthalmic Drugs)   No current facility-administered medications for this visit. (Ophthalmic Drugs)   Current Outpatient Medications (Other)  Medication Sig   Cholecalciferol (VITAMIN D3) 1.25 MG (50000 UT) CAPS Take by mouth as directed.   Multiple Vitamins-Minerals (MULTIVITAMIN ADULT EXTRA C PO)    raloxifene (EVISTA) 60 MG tablet Take 60 mg by mouth daily.   simvastatin (ZOCOR) 20 MG tablet Take 20 mg by mouth daily.   valsartan-hydrochlorothiazide (DIOVAN-HCT) 160-12.5 MG tablet Take by mouth as directed.   No current facility-administered medications for this visit. (Other)   REVIEW OF SYSTEMS: ROS   Positive for: Eyes Negative for: Constitutional, Gastrointestinal, Neurological, Skin, Genitourinary, Musculoskeletal, HENT, Endocrine, Cardiovascular, Respiratory, Psychiatric, Allergic/Imm, Heme/Lymph Last edited by Julieanne Cotton, COT on 03/21/2023  1:45 PM.     ALLERGIES Not on  File  PAST MEDICAL HISTORY History reviewed. No pertinent past medical history. Past Surgical History:  Procedure Laterality Date   CATARACT EXTRACTION     FAMILY HISTORY Family History  Problem Relation Age of Onset   Macular degeneration Mother    Macular degeneration Father    SOCIAL HISTORY Social History   Tobacco Use   Smoking status: Never   Smokeless tobacco: Never       OPHTHALMIC EXAM:  Base Eye Exam     Visual Acuity (Snellen - Linear)       Right Left   Dist [place] 20/20 20/40   Dist ph cc  NI         Tonometry (Tonopen, 1:48 PM)       Right Left   Pressure 14 14         Pupils       Dark Light Shape React APD   Right 3 2 Round Brisk None   Left 3 2 Round Brisk None         Visual Fields       Left Right    Full Full         Extraocular Movement       Right Left    Full, Ortho Full, Ortho         Neuro/Psych     Oriented x3: Yes   Mood/Affect: Normal         Dilation     Both eyes: 1.0% Mydriacyl,  2.5% Phenylephrine @ 1:45 PM           Slit Lamp and Fundus Exam     Slit Lamp Exam       Right Left   Lids/Lashes Dermatochalasis - upper lid Dermatochalasis - upper lid   Conjunctiva/Sclera White and quiet White and quiet   Cornea arcus, trace PEE, mild tear film debris arcus, trace PEE, mild tear film debris   Anterior Chamber Deep and quiet Deep and quiet   Iris Round and dilated Round and dilated   Lens PC IOL in good position, trace Posterior capsular opacification PC IOL in good position, 1+ Posterior capsular opacification   Anterior Vitreous Vitreous syneresis Vitreous syneresis         Fundus Exam       Right Left   Disc Pink and Sharp, Compact, temporal PPA, peripapillary CNV with edema superior disc -- stably improved, no heme mild Pallor, Sharp rim, PPA   C/D Ratio 0.5 0.6   Macula Flat, Blunted foveal reflex, fine Drusen, RPE mottling and clumping, No heme or edema Blunted foveal reflex, central  CNV with edema - stably improved, Drusen, RPE mottling, no heme   Vessels attenuated, Tortuous attenuated, Tortuous   Periphery Attached, mild reticular degeneration, No heme Attached, mild reticular degeneration, No heme           IMAGING AND PROCEDURES  Imaging and Procedures for 03/21/2023  OCT, Retina - OU - Both Eyes       Right Eye Quality was good. Central Foveal Thickness: 309. Progression has been stable. Findings include normal foveal contour, no IRF, no SRF, retinal drusen , subretinal hyper-reflective material, pigment epithelial detachment (stable improvement in peripapillary IRF overlying stable low PED, Mild ERM).   Left Eye Quality was good. Central Foveal Thickness: 270. Progression has been stable. Findings include normal foveal contour, no IRF, no SRF, subretinal hyper-reflective material, intraretinal hyper-reflective material, pigment epithelial detachment, subretinal fluid (stable improvement in central IRF, IRHM and central PED).   Notes *Images captured and stored on drive  Diagnosis / Impression:  Exu ARMD OU OD: stable improvement in peripapillary IRF overlying stable low PED, Mild ERM OS: stable improvement in central IRF, IRHM and central PED  Clinical management:  See below  Abbreviations: NFP - Normal foveal profile. CME - cystoid macular edema. PED - pigment epithelial detachment. IRF - intraretinal fluid. SRF - subretinal fluid. EZ - ellipsoid zone. ERM - epiretinal membrane. ORA - outer retinal atrophy. ORT - outer retinal tubulation. SRHM - subretinal hyper-reflective material. IRHM - intraretinal hyper-reflective material      Intravitreal Injection, Pharmacologic Agent - OS - Left Eye       Time Out 03/21/2023. 3:02 PM. Confirmed correct patient, procedure, site, and patient consented.   Anesthesia Topical anesthesia was used. Anesthetic medications included Lidocaine 2%, Proparacaine 0.5%.   Procedure Preparation included 5% betadine  to ocular surface, eyelid speculum. A (32g) needle was used.   Injection: 2 mg aflibercept 2 MG/0.05ML   Route: Intravitreal, Site: Left Eye   NDC: L6038910, Lot: [id number], Expiration date: 04/03/2024, Waste: 0 mL   Post-op Post injection exam found visual acuity of at least counting fingers. The patient tolerated the procedure well. There were no complications. The patient received written and verbal post procedure care education. Post injection medications were not given.            ASSESSMENT/PLAN:    ICD-10-CM   1. Exudative age-related macular degeneration of both eyes with  active choroidal neovascularization  H35.3231 OCT, Retina - OU - Both Eyes    Intravitreal Injection, Pharmacologic Agent - OS - Left Eye    aflibercept (EYLEA) SOLN 2 mg    2. Essential hypertension  I10     3. Hypertensive retinopathy of both eyes  H35.033     4. Pseudophakia, both eyes  Z96.1      Exudative age related macular degeneration, OU  - pt delayed to follow up from 4 weeks to 8 on 11.14.23  - OD: small peripapillary CNV  - OS: central CNV  - FA (09.13.23) shows OD: Focal peripapillary CNV with leakage superior disc; OS: Central CNV with central blockage from heme, +leakage  - s/p IVA OD #1 (09.13.23), #2 (11.14.23), #3 (12.12.23), #4 (01.09.24), #5 (02.06.24)  - s/p IVA OS #1 (08.16.23), #2 (09.13.23), #3 (11.14.23), #4 (12.12.23), #5 (01.09.24) -- IVA resistance  - s/p IVE OS #1 (02.06.24), #2 (03.12.24)  - BCVA 20/20 OD, 20/40 OS -- stable OU  - OCT shows OD: stable improvement in peripapillary IRF overlying stable low PED, Mild ERM; OS: stable improvement in central IRF, IRHM and central PED at 5 weeks  - recommend IVE OS #3 today, 04.16.24 w/ f/u extended to 6 wks - will hold off on OD again today - pt wishes to proceed with injection - RBA of procedure discussed, questions answered - IVA informed consent obtained and signed, 09.13.23 (OU) - IVE informed consent obtained and  signed, 02.06.24 (OU) - see procedure note - approved for Eylea   - f/u in 6 wks -- DFE/OCT/possible injection(s)  2,3. Hypertensive retinopathy OU - discussed importance of tight BP control - continue to monitor  4. Pseudophakia OU  - s/p CE/IOL OU (Dr. Elmer Picker)  - IOLs in good position, doing well  - continue to monitor  Ophthalmic Meds Ordered this visit:  Meds ordered this encounter  Medications   aflibercept (EYLEA) SOLN 2 mg     Return in about 6 weeks (around 05/02/2023) for f/u exu ARMD OU, DFE, OCT.  There are no Patient Instructions on file for this visit.  Explained the diagnoses, plan, and follow up with the patient and they expressed understanding.  Patient expressed understanding of the importance of proper follow up care.   This document serves as a record of services personally performed by Karie Chimera, MD, PhD. It was created on their behalf by Gerilyn Nestle, COT an ophthalmic technician. The creation of this record is the provider's dictation and/or activities during the visit.    Electronically signed by:  Gerilyn Nestle, COT 04.02.24 8:37 PM  This document serves as a record of services personally performed by Karie Chimera, MD, PhD. It was created on their behalf by Glee Arvin. Manson Passey, OA an ophthalmic technician. The creation of this record is the provider's dictation and/or activities during the visit.    Electronically signed by: Glee Arvin. Kristopher Oppenheim 04.16.2024 8:37 PM  Karie Chimera, M.D., Ph.D. Diseases & Surgery of the Retina and Vitreous [facility]  I have reviewed the above documentation for accuracy and completeness, and I agree with the above. Karie Chimera, M.D., Ph.D. 03/21/23 8:39 PM   Abbreviations: M myopia (nearsighted); A astigmatism; H hyperopia (farsighted); P presbyopia; Mrx spectacle prescription;  CTL contact lenses; OD right eye; OS left eye; OU both eyes  XT exotropia; ET esotropia; PEK  punctate epithelial keratitis; PEE punctate epithelial erosions; DES dry eye syndrome; MGD meibomian  gland dysfunction; ATs artificial tears; PFAT's preservative free artificial tears; NSC nuclear sclerotic cataract; PSC posterior subcapsular cataract; ERM epi-retinal membrane; PVD posterior vitreous detachment; RD retinal detachment; DM diabetes mellitus; DR diabetic retinopathy; NPDR non-proliferative diabetic retinopathy; PDR proliferative diabetic retinopathy; CSME clinically significant macular edema; DME diabetic macular edema; dbh dot blot hemorrhages; CWS cotton wool spot; POAG primary open angle glaucoma; C/D cup-to-disc ratio; HVF humphrey visual field; GVF goldmann visual field; OCT optical coherence tomography; IOP intraocular pressure; BRVO Branch retinal vein occlusion; CRVO central retinal vein occlusion; CRAO central retinal artery occlusion; BRAO branch retinal artery occlusion; RT retinal tear; SB scleral buckle; PPV pars plana vitrectomy; VH Vitreous hemorrhage; PRP panretinal laser photocoagulation; IVK intravitreal kenalog; VMT vitreomacular traction; MH Macular hole;  NVD neovascularization of the disc; NVE neovascularization elsewhere; AREDS age related eye disease study; ARMD age related macular degeneration; POAG primary open angle glaucoma; EBMD epithelial/anterior basement membrane dystrophy; ACIOL anterior chamber intraocular lens; IOL intraocular lens; PCIOL posterior chamber intraocular lens; Phaco/IOL phacoemulsification with intraocular lens placement; PRK photorefractive keratectomy; LASIK laser assisted in situ keratomileusis; HTN hypertension; DM diabetes mellitus; COPD chronic obstructive pulmonary disease

## 2023-03-21 ENCOUNTER — Encounter (INDEPENDENT_AMBULATORY_CARE_PROVIDER_SITE_OTHER): Payer: Self-pay | Admitting: Ophthalmology

## 2023-03-21 ENCOUNTER — Ambulatory Visit (INDEPENDENT_AMBULATORY_CARE_PROVIDER_SITE_OTHER): Payer: Medicare Other | Admitting: Ophthalmology

## 2023-03-21 DIAGNOSIS — H353231 Exudative age-related macular degeneration, bilateral, with active choroidal neovascularization: Secondary | ICD-10-CM | POA: Diagnosis not present

## 2023-03-21 DIAGNOSIS — I10 Essential (primary) hypertension: Secondary | ICD-10-CM | POA: Diagnosis not present

## 2023-03-21 DIAGNOSIS — H35033 Hypertensive retinopathy, bilateral: Secondary | ICD-10-CM | POA: Diagnosis not present

## 2023-03-21 DIAGNOSIS — Z961 Presence of intraocular lens: Secondary | ICD-10-CM

## 2023-03-21 MED ORDER — AFLIBERCEPT 2MG/0.05ML IZ SOLN FOR KALEIDOSCOPE
2.0000 mg | INTRAVITREAL | Status: AC | PRN
  Administered 2023-03-21: 2 mg via INTRAVITREAL

## 2023-04-04 DIAGNOSIS — R54 Age-related physical debility: Secondary | ICD-10-CM | POA: Diagnosis not present

## 2023-04-04 DIAGNOSIS — E559 Vitamin D deficiency, unspecified: Secondary | ICD-10-CM | POA: Diagnosis not present

## 2023-04-04 DIAGNOSIS — M810 Age-related osteoporosis without current pathological fracture: Secondary | ICD-10-CM | POA: Diagnosis not present

## 2023-04-04 DIAGNOSIS — E782 Mixed hyperlipidemia: Secondary | ICD-10-CM | POA: Diagnosis not present

## 2023-04-04 DIAGNOSIS — I10 Essential (primary) hypertension: Secondary | ICD-10-CM | POA: Diagnosis not present

## 2023-04-24 NOTE — Progress Notes (Signed)
[facility] - Clinic Note  05/02/2023    CHIEF COMPLAINT Patient presents for Retina Follow Up   HISTORY OF PRESENT ILLNESS: Audrey Sosa is a 89 y.o. female who presents to the clinic today for:   HPI     Retina Follow Up   Patient presents with  Wet AMD.  In both eyes.  Severity is moderate.  Duration of 6 weeks.  Since onset it is gradually improving.  I, the attending physician,  performed the HPI with the patient and updated documentation appropriately.        Comments   Patient feels that the vision is doing pretty well. She is not using eye drops at this time.       Last edited by Rennis Chris, MD on 05/02/2023 12:41 PM.     Pt states she is not having any vision problems  Referring physician: Georgann Housekeeper, MD [address] [place],  [place] [address]  HISTORICAL INFORMATION:   Selected notes from the MEDICAL RECORD NUMBER Referred by Dr. Zenaida Niece for ex ARMD LEE:  Ocular Hx- PMH-    CURRENT MEDICATIONS: No current outpatient medications on file. (Ophthalmic Drugs)   No current facility-administered medications for this visit. (Ophthalmic Drugs)   Current Outpatient Medications (Other)  Medication Sig   Cholecalciferol (VITAMIN D3) 1.25 MG (50000 UT) CAPS Take by mouth as directed.   Multiple Vitamins-Minerals (MULTIVITAMIN ADULT EXTRA C PO)    raloxifene (EVISTA) 60 MG tablet Take 60 mg by mouth daily.   simvastatin (ZOCOR) 20 MG tablet Take 20 mg by mouth daily.   valsartan-hydrochlorothiazide (DIOVAN-HCT) 160-12.5 MG tablet Take by mouth as directed.   No current facility-administered medications for this visit. (Other)   REVIEW OF SYSTEMS: ROS   Positive for: Eyes Negative for: Constitutional, Gastrointestinal, Neurological, Skin, Genitourinary, Musculoskeletal, HENT, Endocrine, Cardiovascular, Respiratory, Psychiatric, Allergic/Imm, Heme/Lymph Last edited by Julieanne Cotton, COT on 05/02/2023 12:34 PM.       ALLERGIES Not on File  PAST MEDICAL HISTORY History reviewed. No pertinent past medical history. Past Surgical History:  Procedure Laterality Date   CATARACT EXTRACTION     FAMILY HISTORY Family History  Problem Relation Age of Onset   Macular degeneration Mother    Macular degeneration Father    SOCIAL HISTORY Social History   Tobacco Use   Smoking status: Never   Smokeless tobacco: Never       OPHTHALMIC EXAM:  Base Eye Exam     Visual Acuity (Snellen - Linear)       Right Left   Dist [place] 20/20 20/40   Dist ph   NI         Tonometry (Tonopen, 12:36 PM)       Right Left   Pressure 12 14         Pupils       Dark Light Shape React APD   Right 3 2 Round Brisk None   Left 3 2 Round Brisk None         Visual Fields       Left Right    Full Full         Extraocular Movement       Right Left    Full, Ortho Full, Ortho         Neuro/Psych     Oriented x3: Yes   Mood/Affect: Normal         Dilation     Both  eyes: 1.0% Mydriacyl, 2.5% Phenylephrine @ 12:35 PM           Slit Lamp and Fundus Exam     Slit Lamp Exam       Right Left   Lids/Lashes Dermatochalasis - upper lid Dermatochalasis - upper lid   Conjunctiva/Sclera White and quiet White and quiet   Cornea arcus, trace PEE, mild tear film debris arcus, trace PEE, mild tear film debris   Anterior Chamber Deep and quiet Deep and quiet   Iris Round and dilated Round and dilated   Lens PC IOL in good position, trace Posterior capsular opacification PC IOL in good position, 1+ Posterior capsular opacification   Anterior Vitreous Vitreous syneresis Vitreous syneresis         Fundus Exam       Right Left   Disc Pink and Sharp, Compact, temporal PPA, peripapillary CNV with edema superior disc -- stably improved, no heme mild Pallor, Sharp rim, PPA   C/D Ratio 0.5 0.6   Macula Flat, Blunted foveal reflex, fine Drusen, RPE mottling and clumping, No heme or edema  Blunted foveal reflex, central CNV with edema - stably improved, Drusen, RPE mottling, no heme   Vessels attenuated, Tortuous attenuated, Tortuous   Periphery Attached, mild reticular degeneration, No heme Attached, mild reticular degeneration, No heme           IMAGING AND PROCEDURES  Imaging and Procedures for 05/02/2023  OCT, Retina - OU - Both Eyes       Right Eye Quality was good. Central Foveal Thickness: 309. Progression has been stable. Findings include normal foveal contour, no IRF, no SRF, retinal drusen , subretinal hyper-reflective material, pigment epithelial detachment (stable improvement in peripapillary IRF overlying stable low PED, Mild ERM).   Left Eye Quality was borderline. Central Foveal Thickness: 257. Progression has been stable. Findings include normal foveal contour, no IRF, no SRF, subretinal hyper-reflective material, intraretinal hyper-reflective material, pigment epithelial detachment, subretinal fluid (stable improvement in central IRF, IRHM and central PED).   Notes *Images captured and stored on drive  Diagnosis / Impression:  Exu ARMD OU OD: stable improvement in peripapillary IRF overlying stable low PED, Mild ERM OS: stable improvement in central IRF, IRHM and central PED  Clinical management:  See below  Abbreviations: NFP - Normal foveal profile. CME - cystoid macular edema. PED - pigment epithelial detachment. IRF - intraretinal fluid. SRF - subretinal fluid. EZ - ellipsoid zone. ERM - epiretinal membrane. ORA - outer retinal atrophy. ORT - outer retinal tubulation. SRHM - subretinal hyper-reflective material. IRHM - intraretinal hyper-reflective material      Intravitreal Injection, Pharmacologic Agent - OS - Left Eye       Time Out 05/02/2023. 12:47 PM. Confirmed correct patient, procedure, site, and patient consented.   Anesthesia Topical anesthesia was used. Anesthetic medications included Lidocaine 2%, Proparacaine 0.5%.    Procedure Preparation included 5% betadine to ocular surface, eyelid speculum. A (32g) needle was used.   Injection: 2 mg aflibercept 2 MG/0.05ML   Route: Intravitreal, Site: Left Eye   NDC: L6038910, Lot: [id number], Expiration date: 04/03/2024, Waste: 0 mL   Post-op Post injection exam found visual acuity of at least counting fingers. The patient tolerated the procedure well. There were no complications. The patient received written and verbal post procedure care education. Post injection medications were not given.            ASSESSMENT/PLAN:    ICD-10-CM   1. Exudative age-related macular degeneration of  both eyes with active choroidal neovascularization (HCC)  H35.3231 OCT, Retina - OU - Both Eyes    Intravitreal Injection, Pharmacologic Agent - OS - Left Eye    aflibercept (EYLEA) SOLN 2 mg    2. Essential hypertension  I10     3. Hypertensive retinopathy of both eyes  H35.033     4. Pseudophakia, both eyes  Z96.1      Exudative age related macular degeneration, OU  - pt delayed to follow up from 4 weeks to 8 on 11.14.23  - OD: small peripapillary CNV  - OS: central CNV  - FA (09.13.23) shows OD: Focal peripapillary CNV with leakage superior disc; OS: Central CNV with central blockage from heme, +leakage  - s/p IVA OD #1 (09.13.23), #2 (11.14.23), #3 (12.12.23), #4 (01.09.24), #5 (02.06.24)  - s/p IVA OS #1 (08.16.23), #2 (09.13.23), #3 (11.14.23), #4 (12.12.23), #5 (01.09.24) -- IVA resistance  - s/p IVE OS #1 (02.06.24), #2 (03.12.24), #3 (04.16.24)  - BCVA 20/20 OD, 20/40 OS -- stable OU  - OCT shows OD: stable improvement in peripapillary IRF overlying stable low PED, Mild ERM; OS: stable improvement in central IRF, IRHM and central PED at 6 weeks  - recommend IVE OS #4 today, 05.28.24 w/ f/u extended to 6-7 wks - will hold off on OD again today - pt wishes to proceed with injection - RBA of procedure discussed, questions answered - IVA informed consent  obtained and signed, 09.13.23 (OU) - IVE informed consent obtained and signed, 02.06.24 (OU) - see procedure note - approved for Eylea   - f/u in 6-7 wks -- DFE/OCT/possible injection(s)  2,3. Hypertensive retinopathy OU - discussed importance of tight BP control - continue to monitor  4. Pseudophakia OU  - s/p CE/IOL OU (Dr. Elmer Picker)  - IOLs in good position, doing well  - continue to monitor  Ophthalmic Meds Ordered this visit:  Meds ordered this encounter  Medications   aflibercept (EYLEA) SOLN 2 mg     Return for f/u 6-7 weeks, exu ARMD OU, DFE, OCT.  There are no Patient Instructions on file for this visit.  Explained the diagnoses, plan, and follow up with the patient and they expressed understanding.  Patient expressed understanding of the importance of proper follow up care.   This document serves as a record of services personally performed by Karie Chimera, MD, PhD. It was created on their behalf by Gerilyn Nestle, COT an ophthalmic technician. The creation of this record is the provider's dictation and/or activities during the visit.    Electronically signed by:  Gerilyn Nestle, COT 05.20.24 1:18 PM  This document serves as a record of services personally performed by Karie Chimera, MD, PhD. It was created on their behalf by Glee Arvin. Manson Passey, OA an ophthalmic technician. The creation of this record is the provider's dictation and/or activities during the visit.    Electronically signed by: Glee Arvin. Manson Passey, [place] 05.28.2024 1:18 PM   Karie Chimera, M.D., Ph.D. Diseases & Surgery of the Retina and Vitreous [facility]  I have reviewed the above documentation for accuracy and completeness, and I agree with the above. Karie Chimera, M.D., Ph.D. 05/02/23 1:18 PM  Abbreviations: M myopia (nearsighted); A astigmatism; H hyperopia (farsighted); P presbyopia; Mrx spectacle prescription;  CTL contact lenses; OD right eye; OS left eye; OU  both eyes  XT exotropia; ET esotropia; PEK punctate epithelial keratitis; PEE punctate epithelial erosions; DES dry eye syndrome;  MGD meibomian gland dysfunction; ATs artificial tears; PFAT's preservative free artificial tears; NSC nuclear sclerotic cataract; PSC posterior subcapsular cataract; ERM epi-retinal membrane; PVD posterior vitreous detachment; RD retinal detachment; DM diabetes mellitus; DR diabetic retinopathy; NPDR non-proliferative diabetic retinopathy; PDR proliferative diabetic retinopathy; CSME clinically significant macular edema; DME diabetic macular edema; dbh dot blot hemorrhages; CWS cotton wool spot; POAG primary open angle glaucoma; C/D cup-to-disc ratio; HVF humphrey visual field; GVF goldmann visual field; OCT optical coherence tomography; IOP intraocular pressure; BRVO Branch retinal vein occlusion; CRVO central retinal vein occlusion; CRAO central retinal artery occlusion; BRAO branch retinal artery occlusion; RT retinal tear; SB scleral buckle; PPV pars plana vitrectomy; VH Vitreous hemorrhage; PRP panretinal laser photocoagulation; IVK intravitreal kenalog; VMT vitreomacular traction; MH Macular hole;  NVD neovascularization of the disc; NVE neovascularization elsewhere; AREDS age related eye disease study; ARMD age related macular degeneration; POAG primary open angle glaucoma; EBMD epithelial/anterior basement membrane dystrophy; ACIOL anterior chamber intraocular lens; IOL intraocular lens; PCIOL posterior chamber intraocular lens; Phaco/IOL phacoemulsification with intraocular lens placement; PRK photorefractive keratectomy; LASIK laser assisted in situ keratomileusis; HTN hypertension; DM diabetes mellitus; COPD chronic obstructive pulmonary disease

## 2023-05-02 ENCOUNTER — Ambulatory Visit (INDEPENDENT_AMBULATORY_CARE_PROVIDER_SITE_OTHER): Payer: Medicare Other | Admitting: Ophthalmology

## 2023-05-02 ENCOUNTER — Encounter (INDEPENDENT_AMBULATORY_CARE_PROVIDER_SITE_OTHER): Payer: Self-pay | Admitting: Ophthalmology

## 2023-05-02 DIAGNOSIS — H353231 Exudative age-related macular degeneration, bilateral, with active choroidal neovascularization: Secondary | ICD-10-CM | POA: Diagnosis not present

## 2023-05-02 DIAGNOSIS — I10 Essential (primary) hypertension: Secondary | ICD-10-CM | POA: Diagnosis not present

## 2023-05-02 DIAGNOSIS — Z961 Presence of intraocular lens: Secondary | ICD-10-CM

## 2023-05-02 DIAGNOSIS — H35033 Hypertensive retinopathy, bilateral: Secondary | ICD-10-CM

## 2023-05-02 MED ORDER — AFLIBERCEPT 2MG/0.05ML IZ SOLN FOR KALEIDOSCOPE
2.0000 mg | INTRAVITREAL | Status: AC | PRN
  Administered 2023-05-02: 2 mg via INTRAVITREAL

## 2023-06-07 NOTE — Progress Notes (Signed)
[facility] - Clinic Note  06/20/2023    CHIEF COMPLAINT Patient presents for Retina Follow Up   HISTORY OF PRESENT ILLNESS: Audrey Sosa is a 89 y.o. female who presents to the clinic today for:   HPI     Retina Follow Up   Patient presents with  Wet AMD.  In both eyes.  This started 7 weeks ago.  I, the attending physician,  performed the HPI with the patient and updated documentation appropriately.        Comments   Patient here for 7 weeks retina follow up for exu ARMD OU. Patient states vision is pretty good. Hard to tell sometimes., no eye pain. Not using drops.      Last edited by Rennis Chris, MD on 06/20/2023  4:53 PM.    Pt states she is not having any vision problems  Referring physician: Georgann Housekeeper, MD [address] [place],  [place] [address]  HISTORICAL INFORMATION:   Selected notes from the MEDICAL RECORD NUMBER Referred by Dr. Zenaida Niece for ex ARMD LEE:  Ocular Hx- PMH-    CURRENT MEDICATIONS: No current outpatient medications on file. (Ophthalmic Drugs)   No current facility-administered medications for this visit. (Ophthalmic Drugs)   Current Outpatient Medications (Other)  Medication Sig   Cholecalciferol (VITAMIN D3) 1.25 MG (50000 UT) CAPS Take by mouth as directed.   Multiple Vitamins-Minerals (MULTIVITAMIN ADULT EXTRA C PO)    raloxifene (EVISTA) 60 MG tablet Take 60 mg by mouth daily.   simvastatin (ZOCOR) 20 MG tablet Take 20 mg by mouth daily.   valsartan-hydrochlorothiazide (DIOVAN-HCT) 160-12.5 MG tablet Take by mouth as directed.   No current facility-administered medications for this visit. (Other)   REVIEW OF SYSTEMS: ROS   Positive for: Eyes Negative for: Constitutional, Gastrointestinal, Neurological, Skin, Genitourinary, Musculoskeletal, HENT, Endocrine, Cardiovascular, Respiratory, Psychiatric, Allergic/Imm, Heme/Lymph Last edited by Laddie Aquas, COA on 06/20/2023 12:54 PM.      ALLERGIES Not on File  PAST MEDICAL HISTORY History reviewed. No pertinent past medical history. Past Surgical History:  Procedure Laterality Date   CATARACT EXTRACTION     FAMILY HISTORY Family History  Problem Relation Age of Onset   Macular degeneration Mother    Macular degeneration Father    SOCIAL HISTORY Social History   Tobacco Use   Smoking status: Never   Smokeless tobacco: Never  Vaping Use   Vaping status: Never Used       OPHTHALMIC EXAM:  Base Eye Exam     Visual Acuity (Snellen - Linear)       Right Left   Dist [place] 20/25 20/30 -1   Dist ph  20/25 +2 20/25 -1         Tonometry (Tonopen, 12:51 PM)       Right Left   Pressure 15 12         Pupils       Dark Light Shape React APD   Right 3 2 Round Brisk None   Left 3 2 Round Brisk None         Visual Fields (Counting fingers)       Left Right    Full Full         Extraocular Movement       Right Left    Full, Ortho Full, Ortho         Neuro/Psych     Oriented x3: Yes   Mood/Affect: Normal  Dilation     Both eyes: 1.0% Mydriacyl, 2.5% Phenylephrine @ 12:50 PM           Slit Lamp and Fundus Exam     Slit Lamp Exam       Right Left   Lids/Lashes Dermatochalasis - upper lid Dermatochalasis - upper lid   Conjunctiva/Sclera White and quiet White and quiet   Cornea arcus, trace PEE, mild tear film debris arcus, trace PEE, mild tear film debris   Anterior Chamber Deep and quiet Deep and quiet   Iris Round and dilated Round and dilated   Lens PC IOL in good position, trace Posterior capsular opacification PC IOL in good position, 1+ Posterior capsular opacification   Anterior Vitreous Vitreous syneresis Vitreous syneresis         Fundus Exam       Right Left   Disc Pink and Sharp, Compact, temporal PPA, peripapillary CNV with edema superior disc -- stably improved, no heme mild Pallor, Sharp rim, PPA   C/D Ratio 0.5 0.6   Macula Flat, Blunted  foveal reflex, fine Drusen, RPE mottling and clumping, No heme or edema Blunted foveal reflex, central CNV with edema - slightly increased, Drusen, RPE mottling, no heme   Vessels attenuated, Tortuous attenuated, Tortuous   Periphery Attached, mild reticular degeneration, No heme Attached, mild reticular degeneration, No heme, focal peripheral drusen temporal periphery           IMAGING AND PROCEDURES  Imaging and Procedures for 06/20/2023  OCT, Retina - OU - Both Eyes       Right Eye Quality was good. Central Foveal Thickness: 290. Progression has been stable. Findings include normal foveal contour, no IRF, no SRF, retinal drusen , subretinal hyper-reflective material, epiretinal membrane, pigment epithelial detachment (stable improvement in peripapillary IRF overlying stable low PED superior disc, Mild ERM).   Left Eye Quality was good. Central Foveal Thickness: 296. Progression has worsened. Findings include normal foveal contour, no IRF, no SRF, subretinal hyper-reflective material, intraretinal hyper-reflective material, pigment epithelial detachment, subretinal fluid (Interval re-development of central SRF, interval increase in cental IRF/cystic changes, stable central PED).   Notes *Images captured and stored on drive  Diagnosis / Impression:  Exu ARMD OU OD: stable improvement in peripapillary IRF overlying stable low PED superior disc, Mild ERM OS: Interval re-development of central SRF, interval increase in cental IRF/cystic changes, stable central PED  Clinical management:  See below  Abbreviations: NFP - Normal foveal profile. CME - cystoid macular edema. PED - pigment epithelial detachment. IRF - intraretinal fluid. SRF - subretinal fluid. EZ - ellipsoid zone. ERM - epiretinal membrane. ORA - outer retinal atrophy. ORT - outer retinal tubulation. SRHM - subretinal hyper-reflective material. IRHM - intraretinal hyper-reflective material      Intravitreal Injection,  Pharmacologic Agent - OS - Left Eye       Time Out 06/20/2023. 1:11 PM. Confirmed correct patient, procedure, site, and patient consented.   Anesthesia Topical anesthesia was used. Anesthetic medications included Lidocaine 2%, Proparacaine 0.5%.   Procedure Preparation included 5% betadine to ocular surface, eyelid speculum. A (32g) needle was used.   Injection: 2 mg aflibercept 2 MG/0.05ML   Route: Intravitreal, Site: Left Eye   NDC: L6038910, Lot: [id number], Expiration date: 06/03/2024, Waste: 0 mL   Post-op Post injection exam found visual acuity of at least counting fingers. The patient tolerated the procedure well. There were no complications. The patient received written and verbal post procedure care education. Post injection medications were  not given.            ASSESSMENT/PLAN:    ICD-10-CM   1. Exudative age-related macular degeneration of both eyes with active choroidal neovascularization (HCC)  H35.3231 OCT, Retina - OU - Both Eyes    Intravitreal Injection, Pharmacologic Agent - OS - Left Eye    aflibercept (EYLEA) SOLN 2 mg    2. Essential hypertension  I10     3. Hypertensive retinopathy of both eyes  H35.033     4. Pseudophakia, both eyes  Z96.1      Exudative age related macular degeneration, OU  - pt delayed to follow up from 4 weeks to 8 on 11.14.23  - OD: small peripapillary CNV  - OS: central CNV  - FA (09.13.23) shows OD: Focal peripapillary CNV with leakage superior disc; OS: Central CNV with central blockage from heme, +leakage  - s/p IVA OD #1 (09.13.23), #2 (11.14.23), #3 (12.12.23), #4 (01.09.24), #5 (02.06.24)  - s/p IVA OS #1 (08.16.23), #2 (09.13.23), #3 (11.14.23), #4 (12.12.23), #5 (01.09.24) -- IVA resistance  - s/p IVE OS #1 (02.06.24), #2 (03.12.24), #3 (04.16.24), #4 (05.28.24)  **h/o increased edema at 7 wks noted on 07.16.24 exam**  - BCVA 20/25 OD, 20/25 OS -- improved OS  - OCT shows OD: stable improvement in peripapillary  IRF overlying stable low PED superior disc at 5+ mos since last injxn (IVA OD #5); OS: Interval re-development of central SRF, interval increase in cental IRF/cystic changes, stable central PED at 7 weeks  - recommend IVE OS #5 today, 07.16.24 w/ f/u back to 6 wks - will hold off on OD again today - pt wishes to proceed with injection - RBA of procedure discussed, questions answered - IVE informed consent obtained and signed, 02.06.24 (OU) - see procedure note - approved for Eylea   - f/u in 6 wks -- DFE/OCT/possible injection(s)  2,3. Hypertensive retinopathy OU - discussed importance of tight BP control - continue to monitor  4. Pseudophakia OU  - s/p CE/IOL OU (Dr. Elmer Picker)  - IOLs in good position, doing well  - continue to monitor  Ophthalmic Meds Ordered this visit:  Meds ordered this encounter  Medications   aflibercept (EYLEA) SOLN 2 mg     Return in about 6 weeks (around 08/01/2023) for f/u exu ARMD OU, DFE, OCT.  There are no Patient Instructions on file for this visit.  Explained the diagnoses, plan, and follow up with the patient and they expressed understanding.  Patient expressed understanding of the importance of proper follow up care.   This document serves as a record of services personally performed by Karie Chimera, MD, PhD. It was created on their behalf by Gerilyn Nestle, COT an ophthalmic technician. The creation of this record is the provider's dictation and/or activities during the visit.    Electronically signed by:  Charlette Caffey, COT  06/20/23 4:54 PM  This document serves as a record of services personally performed by Karie Chimera, MD, PhD. It was created on their behalf by Glee Arvin. Manson Passey, OA an ophthalmic technician. The creation of this record is the provider's dictation and/or activities during the visit.    Electronically signed by: Glee Arvin. Manson Passey, OA 06/20/23 4:54 PM  Karie Chimera, M.D., Ph.D. Diseases & Surgery of the Retina  and Vitreous [facility]  I have reviewed the above documentation for accuracy and completeness, and I agree with the above. Karie Chimera, M.D.,  Ph.D. 06/20/23 4:56 PM  Abbreviations: M myopia (nearsighted); A astigmatism; H hyperopia (farsighted); P presbyopia; Mrx spectacle prescription;  CTL contact lenses; OD right eye; OS left eye; OU both eyes  XT exotropia; ET esotropia; PEK punctate epithelial keratitis; PEE punctate epithelial erosions; DES dry eye syndrome; MGD meibomian gland dysfunction; ATs artificial tears; PFAT's preservative free artificial tears; NSC nuclear sclerotic cataract; PSC posterior subcapsular cataract; ERM epi-retinal membrane; PVD posterior vitreous detachment; RD retinal detachment; DM diabetes mellitus; DR diabetic retinopathy; NPDR non-proliferative diabetic retinopathy; PDR proliferative diabetic retinopathy; CSME clinically significant macular edema; DME diabetic macular edema; dbh dot blot hemorrhages; CWS cotton wool spot; POAG primary open angle glaucoma; C/D cup-to-disc ratio; HVF humphrey visual field; GVF goldmann visual field; OCT optical coherence tomography; IOP intraocular pressure; BRVO Branch retinal vein occlusion; CRVO central retinal vein occlusion; CRAO central retinal artery occlusion; BRAO branch retinal artery occlusion; RT retinal tear; SB scleral buckle; PPV pars plana vitrectomy; VH Vitreous hemorrhage; PRP panretinal laser photocoagulation; IVK intravitreal kenalog; VMT vitreomacular traction; MH Macular hole;  NVD neovascularization of the disc; NVE neovascularization elsewhere; AREDS age related eye disease study; ARMD age related macular degeneration; POAG primary open angle glaucoma; EBMD epithelial/anterior basement membrane dystrophy; ACIOL anterior chamber intraocular lens; IOL intraocular lens; PCIOL posterior chamber intraocular lens; Phaco/IOL phacoemulsification with intraocular lens placement; PRK photorefractive  keratectomy; LASIK laser assisted in situ keratomileusis; HTN hypertension; DM diabetes mellitus; COPD chronic obstructive pulmonary disease

## 2023-06-20 ENCOUNTER — Encounter (INDEPENDENT_AMBULATORY_CARE_PROVIDER_SITE_OTHER): Payer: Self-pay | Admitting: Ophthalmology

## 2023-06-20 ENCOUNTER — Ambulatory Visit (INDEPENDENT_AMBULATORY_CARE_PROVIDER_SITE_OTHER): Payer: Medicare Other | Admitting: Ophthalmology

## 2023-06-20 DIAGNOSIS — H35033 Hypertensive retinopathy, bilateral: Secondary | ICD-10-CM

## 2023-06-20 DIAGNOSIS — H353231 Exudative age-related macular degeneration, bilateral, with active choroidal neovascularization: Secondary | ICD-10-CM | POA: Diagnosis not present

## 2023-06-20 DIAGNOSIS — I10 Essential (primary) hypertension: Secondary | ICD-10-CM | POA: Diagnosis not present

## 2023-06-20 DIAGNOSIS — Z961 Presence of intraocular lens: Secondary | ICD-10-CM | POA: Diagnosis not present

## 2023-06-20 MED ORDER — AFLIBERCEPT 2MG/0.05ML IZ SOLN FOR KALEIDOSCOPE
2.0000 mg | INTRAVITREAL | Status: AC | PRN
  Administered 2023-06-20: 2 mg via INTRAVITREAL

## 2023-07-13 DIAGNOSIS — I10 Essential (primary) hypertension: Secondary | ICD-10-CM | POA: Diagnosis not present

## 2023-07-13 DIAGNOSIS — E782 Mixed hyperlipidemia: Secondary | ICD-10-CM | POA: Diagnosis not present

## 2023-07-13 DIAGNOSIS — Z0000 Encounter for general adult medical examination without abnormal findings: Secondary | ICD-10-CM | POA: Diagnosis not present

## 2023-07-13 DIAGNOSIS — M810 Age-related osteoporosis without current pathological fracture: Secondary | ICD-10-CM | POA: Diagnosis not present

## 2023-07-13 DIAGNOSIS — H3530 Unspecified macular degeneration: Secondary | ICD-10-CM | POA: Diagnosis not present

## 2023-07-20 NOTE — Progress Notes (Signed)
[facility] - Clinic Note  08/03/2023    CHIEF COMPLAINT Patient presents for Retina Follow Up   HISTORY OF PRESENT ILLNESS: Audrey Sosa is a 89 y.o. female who presents to the clinic today for:   HPI     Retina Follow Up   Patient presents with  Wet AMD.  In both eyes.  Severity is mild.  Duration of 6 weeks.  Since onset it is stable.  I, the attending physician,  performed the HPI with the patient and updated documentation appropriately.        Comments   6 week Retina eval for Exu armd ou. Patient staes vision is doing pretty good      Last edited by Rennis Chris, MD on 08/03/2023 12:55 PM.     Patient feels the vision is doing well.   Referring physician: Diona Foley, MD [address] [place],  [place] [address]  HISTORICAL INFORMATION:   Selected notes from the MEDICAL RECORD NUMBER Referred by Dr. Zenaida Niece for ex ARMD LEE:  Ocular Hx- PMH-    CURRENT MEDICATIONS: No current outpatient medications on file. (Ophthalmic Drugs)   No current facility-administered medications for this visit. (Ophthalmic Drugs)   Current Outpatient Medications (Other)  Medication Sig   Cholecalciferol (VITAMIN D3) 1.25 MG (50000 UT) CAPS Take by mouth as directed.   Multiple Vitamins-Minerals (MULTIVITAMIN ADULT EXTRA C PO)    raloxifene (EVISTA) 60 MG tablet Take 60 mg by mouth daily.   simvastatin (ZOCOR) 20 MG tablet Take 20 mg by mouth daily.   valsartan-hydrochlorothiazide (DIOVAN-HCT) 160-12.5 MG tablet Take by mouth as directed.   No current facility-administered medications for this visit. (Other)   REVIEW OF SYSTEMS: ROS   Positive for: Eyes Negative for: Constitutional, Gastrointestinal, Neurological, Skin, Genitourinary, Musculoskeletal, HENT, Endocrine, Cardiovascular, Respiratory, Psychiatric, Allergic/Imm, Heme/Lymph Last edited by Lana Fish, COT on 08/03/2023  8:59 AM.      ALLERGIES Not on File  PAST MEDICAL HISTORY History  reviewed. No pertinent past medical history. Past Surgical History:  Procedure Laterality Date   CATARACT EXTRACTION     FAMILY HISTORY Family History  Problem Relation Age of Onset   Macular degeneration Mother    Macular degeneration Father    SOCIAL HISTORY Social History   Tobacco Use   Smoking status: Never   Smokeless tobacco: Never  Vaping Use   Vaping status: Never Used       OPHTHALMIC EXAM:  Base Eye Exam     Visual Acuity (Snellen - Linear)       Right Left   Dist [place] 20/20-2 20/20-2         Tonometry (Tonopen, 9:00 AM)       Right Left   Pressure 15 11         Pupils       Dark Light Shape React APD   Right 3 2 Round Brisk None   Left 3 2 Round Brisk None         Visual Fields (Counting fingers)       Left Right    Full Full         Extraocular Movement       Right Left    Full, Ortho Full, Ortho         Neuro/Psych     Oriented x3: Yes   Mood/Affect: Normal         Dilation     Both eyes: 1.0% Mydriacyl,  2.5% Phenylephrine @ 9:00 AM           Slit Lamp and Fundus Exam     Slit Lamp Exam       Right Left   Lids/Lashes Dermatochalasis - upper lid Dermatochalasis - upper lid   Conjunctiva/Sclera White and quiet White and quiet   Cornea arcus, trace PEE, mild tear film debris arcus, trace PEE, mild tear film debris   Anterior Chamber Deep and quiet Deep and quiet   Iris Round and dilated Round and dilated   Lens PC IOL in good position, trace Posterior capsular opacification PC IOL in good position, 1+ Posterior capsular opacification   Anterior Vitreous Vitreous syneresis Vitreous syneresis         Fundus Exam       Right Left   Disc Pink and Sharp, Compact, temporal PPA, peripapillary CNV with edema superior disc -- stably improved, no heme mild Pallor, Sharp rim, PPA   C/D Ratio 0.5 0.6   Macula Flat, Blunted foveal reflex, fine Drusen, RPE mottling and clumping, No heme or edema Blunted foveal reflex,  central CNV with edema - improved, Drusen, RPE mottling, no heme   Vessels attenuated, Tortuous attenuated, Tortuous   Periphery Attached, mild reticular degeneration, No heme Attached, mild reticular degeneration, No heme, focal peripheral drusen temporal periphery           IMAGING AND PROCEDURES  Imaging and Procedures for 08/03/2023  OCT, Retina - OU - Both Eyes       Right Eye Quality was good. Central Foveal Thickness: 290. Progression has been stable. Findings include normal foveal contour, no IRF, no SRF, retinal drusen , subretinal hyper-reflective material, epiretinal membrane, pigment epithelial detachment (stable improvement in peripapillary IRF overlying stable low PED superior disc, Mild ERM).   Left Eye Quality was good. Central Foveal Thickness: 278. Progression has improved. Findings include normal foveal contour, no SRF, subretinal hyper-reflective material, intraretinal hyper-reflective material, pigment epithelial detachment, subretinal fluid (Interval resolution of central SRF, persistent IRF/cystic changes--improved, stable central PED).   Notes *Images captured and stored on drive  Diagnosis / Impression:  Exu ARMD OU OD: stable improvement in peripapillary IRF overlying stable low PED superior disc, Mild ERM OS: Interval resolution of central SRF, persistent IRF/cystic changes--improved, stable central PED  Clinical management:  See below  Abbreviations: NFP - Normal foveal profile. CME - cystoid macular edema. PED - pigment epithelial detachment. IRF - intraretinal fluid. SRF - subretinal fluid. EZ - ellipsoid zone. ERM - epiretinal membrane. ORA - outer retinal atrophy. ORT - outer retinal tubulation. SRHM - subretinal hyper-reflective material. IRHM - intraretinal hyper-reflective material      Intravitreal Injection, Pharmacologic Agent - OS - Left Eye       Time Out 08/03/2023. 9:49 AM. Confirmed correct patient, procedure, site, and patient  consented.   Anesthesia Topical anesthesia was used. Anesthetic medications included Lidocaine 2%, Proparacaine 0.5%.   Procedure Preparation included 5% betadine to ocular surface, eyelid speculum. A (32g) needle was used.   Injection: 2 mg aflibercept 2 MG/0.05ML   Route: Intravitreal, Site: Left Eye   NDC: L6038910, Lot: [id number], Expiration date: 11/03/2024, Waste: 0 mL   Post-op Post injection exam found visual acuity of at least counting fingers. The patient tolerated the procedure well. There were no complications. The patient received written and verbal post procedure care education. Post injection medications were not given.            ASSESSMENT/PLAN:    ICD-10-CM  1. Exudative age-related macular degeneration of both eyes with active choroidal neovascularization (HCC)  H35.3231 OCT, Retina - OU - Both Eyes    Intravitreal Injection, Pharmacologic Agent - OS - Left Eye    aflibercept (EYLEA) SOLN 2 mg    2. Essential hypertension  I10     3. Hypertensive retinopathy of both eyes  H35.033     4. Pseudophakia, both eyes  Z96.1      Exudative age related macular degeneration, OU  - pt delayed to follow up from 4 weeks to 8 on 11.14.23  - OD: small peripapillary CNV  - OS: central CNV - FA (09.13.23) shows OD: Focal peripapillary CNV with leakage superior disc; OS: Central CNV with central blockage from heme, +leakage - s/p IVA OD #1 (09.13.23), #2 (11.14.23), #3 (12.12.23), #4 (01.09.24), #5 (02.06.24) - s/p IVA OS #1 (08.16.23), #2 (09.13.23), #3 (11.14.23), #4 (12.12.23), #5 (01.09.24) -- IVA resistance - s/p IVE OS #1 (02.06.24), #2 (03.12.24), #3 (04.16.24), #4 (05.28.24), #5 (07.16.24)  **h/o increased edema at 7 wks noted on 07.16.24 exam**  - BCVA 20/20 OU - improved - OCT shows OD: stable improvement in peripapillary IRF overlying stable low PED superior disc at 6+ mos since last injxn (IVA OD #5); OS: Interval resolution of central SRF, persistent  IRF/cystic changes--improved, stable central PED at 6 weeks  - recommend IVE OS #6 today, 08.29.24 w/ f/u in 6 wks - will hold off on OD again today - pt wishes to proceed with injection - RBA of procedure discussed, questions answered - IVE informed consent obtained and signed, 02.06.24 (OU) - see procedure note - approved for Eylea   - f/u in 6 wks -- DFE/OCT/possible injection(s)  2,3. Hypertensive retinopathy OU - discussed importance of tight BP control - continue to monitor  4. Pseudophakia OU  - s/p CE/IOL OU (Dr. Elmer Picker)  - IOLs in good position, doing well  - continue to monitor  Ophthalmic Meds Ordered this visit:  Meds ordered this encounter  Medications   aflibercept (EYLEA) SOLN 2 mg     Return in about 6 weeks (around 09/14/2023) for f/u Ex. AMD OU, DFE, OCT, Possible, IVE, OS.  There are no Patient Instructions on file for this visit.  Explained the diagnoses, plan, and follow up with the patient and they expressed understanding.  Patient expressed understanding of the importance of proper follow up care.   This document serves as a record of services personally performed by Karie Chimera, MD, PhD. It was created on their behalf by Glee Arvin. Manson Passey, OA an ophthalmic technician. The creation of this record is the provider's dictation and/or activities during the visit.    Electronically signed by: Glee Arvin. Manson Passey, OA 08/03/23 1:01 PM  This document serves as a record of services personally performed by Karie Chimera, MD, PhD. It was created on their behalf by Laurey Morale, COT an ophthalmic technician. The creation of this record is the provider's dictation and/or activities during the visit.    Electronically signed by:  Charlette Caffey, COT  08/03/23 1:01 PM   Karie Chimera, M.D., Ph.D. Diseases & Surgery of the Retina and Vitreous [facility]  I have reviewed the above documentation for accuracy and completeness, and I  agree with the above. Karie Chimera, M.D., Ph.D. 08/03/23 1:03 PM   Abbreviations: M myopia (nearsighted); A astigmatism; H hyperopia (farsighted); P presbyopia; Mrx spectacle prescription;  CTL contact lenses; OD right  eye; OS left eye; OU both eyes  XT exotropia; ET esotropia; PEK punctate epithelial keratitis; PEE punctate epithelial erosions; DES dry eye syndrome; MGD meibomian gland dysfunction; ATs artificial tears; PFAT's preservative free artificial tears; NSC nuclear sclerotic cataract; PSC posterior subcapsular cataract; ERM epi-retinal membrane; PVD posterior vitreous detachment; RD retinal detachment; DM diabetes mellitus; DR diabetic retinopathy; NPDR non-proliferative diabetic retinopathy; PDR proliferative diabetic retinopathy; CSME clinically significant macular edema; DME diabetic macular edema; dbh dot blot hemorrhages; CWS cotton wool spot; POAG primary open angle glaucoma; C/D cup-to-disc ratio; HVF humphrey visual field; GVF goldmann visual field; OCT optical coherence tomography; IOP intraocular pressure; BRVO Branch retinal vein occlusion; CRVO central retinal vein occlusion; CRAO central retinal artery occlusion; BRAO branch retinal artery occlusion; RT retinal tear; SB scleral buckle; PPV pars plana vitrectomy; VH Vitreous hemorrhage; PRP panretinal laser photocoagulation; IVK intravitreal kenalog; VMT vitreomacular traction; MH Macular hole;  NVD neovascularization of the disc; NVE neovascularization elsewhere; AREDS age related eye disease study; ARMD age related macular degeneration; POAG primary open angle glaucoma; EBMD epithelial/anterior basement membrane dystrophy; ACIOL anterior chamber intraocular lens; IOL intraocular lens; PCIOL posterior chamber intraocular lens; Phaco/IOL phacoemulsification with intraocular lens placement; PRK photorefractive keratectomy; LASIK laser assisted in situ keratomileusis; HTN hypertension; DM diabetes mellitus; COPD chronic obstructive  pulmonary disease

## 2023-08-01 ENCOUNTER — Encounter (INDEPENDENT_AMBULATORY_CARE_PROVIDER_SITE_OTHER): Payer: Medicare Other | Admitting: Ophthalmology

## 2023-08-03 ENCOUNTER — Encounter (INDEPENDENT_AMBULATORY_CARE_PROVIDER_SITE_OTHER): Payer: Self-pay | Admitting: Ophthalmology

## 2023-08-03 ENCOUNTER — Ambulatory Visit (INDEPENDENT_AMBULATORY_CARE_PROVIDER_SITE_OTHER): Payer: Medicare Other | Admitting: Ophthalmology

## 2023-08-03 DIAGNOSIS — H35033 Hypertensive retinopathy, bilateral: Secondary | ICD-10-CM

## 2023-08-03 DIAGNOSIS — Z961 Presence of intraocular lens: Secondary | ICD-10-CM

## 2023-08-03 DIAGNOSIS — I10 Essential (primary) hypertension: Secondary | ICD-10-CM | POA: Diagnosis not present

## 2023-08-03 DIAGNOSIS — H353231 Exudative age-related macular degeneration, bilateral, with active choroidal neovascularization: Secondary | ICD-10-CM

## 2023-08-03 MED ORDER — AFLIBERCEPT 2MG/0.05ML IZ SOLN FOR KALEIDOSCOPE
2.0000 mg | INTRAVITREAL | Status: AC | PRN
Start: 2023-08-03 — End: 2023-08-03
  Administered 2023-08-03: 2 mg via INTRAVITREAL

## 2023-09-05 NOTE — Progress Notes (Signed)
[facility] - Clinic Note  09/19/2023    CHIEF COMPLAINT Patient presents for Retina Follow Up   HISTORY OF PRESENT ILLNESS: Audrey Sosa is a 89 y.o. female who presents to the clinic today for:   HPI     Retina Follow Up   Patient presents with  Wet AMD.  In both eyes.  This started 6 weeks ago.  I, the attending physician,  performed the HPI with the patient and updated documentation appropriately.        Comments   Patient here for 6 weeks retina follow up for exu ARMD OU. Patient state vision doing good. No eye pain.       Last edited by Rennis Chris, MD on 09/19/2023  1:01 PM.     Patient states the vision is the same.  Referring physician: Georgann Housekeeper, MD [address] [place],  [place] [address]  HISTORICAL INFORMATION:   Selected notes from the MEDICAL RECORD NUMBER Referred by Dr. Zenaida Niece for ex ARMD LEE:  Ocular Hx- PMH-    CURRENT MEDICATIONS: No current outpatient medications on file. (Ophthalmic Drugs)   No current facility-administered medications for this visit. (Ophthalmic Drugs)   Current Outpatient Medications (Other)  Medication Sig   Cholecalciferol (VITAMIN D3) 1.25 MG (50000 UT) CAPS Take by mouth as directed.   Multiple Vitamins-Minerals (MULTIVITAMIN ADULT EXTRA C PO)    raloxifene (EVISTA) 60 MG tablet Take 60 mg by mouth daily.   simvastatin (ZOCOR) 20 MG tablet Take 20 mg by mouth daily.   valsartan-hydrochlorothiazide (DIOVAN-HCT) 160-12.5 MG tablet Take by mouth as directed.   No current facility-administered medications for this visit. (Other)   REVIEW OF SYSTEMS: ROS   Positive for: Eyes Negative for: Constitutional, Gastrointestinal, Neurological, Skin, Genitourinary, Musculoskeletal, HENT, Endocrine, Cardiovascular, Respiratory, Psychiatric, Allergic/Imm, Heme/Lymph Last edited by Laddie Aquas, COA on 09/19/2023  9:03 AM.       ALLERGIES Not on File  PAST MEDICAL  HISTORY History reviewed. No pertinent past medical history. Past Surgical History:  Procedure Laterality Date   CATARACT EXTRACTION     FAMILY HISTORY Family History  Problem Relation Age of Onset   Macular degeneration Mother    Macular degeneration Father    SOCIAL HISTORY Social History   Tobacco Use   Smoking status: Never   Smokeless tobacco: Never  Vaping Use   Vaping status: Never Used       OPHTHALMIC EXAM:  Base Eye Exam     Visual Acuity (Snellen - Linear)       Right Left   Dist [place] 20/20 -1 20/20 -2         Tonometry (Tonopen, 9:01 AM)       Right Left   Pressure 12 10         Pupils       Dark Light Shape React APD   Right 3 2 Round Brisk None   Left 3 2 Round Brisk None         Visual Fields (Counting fingers)       Left Right    Full Full         Extraocular Movement       Right Left    Full, Ortho Full, Ortho         Neuro/Psych     Oriented x3: Yes   Mood/Affect: Normal         Dilation     Both eyes:  1.0% Mydriacyl, 2.5% Phenylephrine @ 9:01 AM           Slit Lamp and Fundus Exam     Slit Lamp Exam       Right Left   Lids/Lashes Dermatochalasis - upper lid Dermatochalasis - upper lid   Conjunctiva/Sclera White and quiet White and quiet   Cornea arcus, trace PEE, mild tear film debris arcus, trace PEE, mild tear film debris   Anterior Chamber Deep and quiet Deep and quiet   Iris Round and dilated Round and dilated   Lens PC IOL in good position, trace Posterior capsular opacification PC IOL in good position, 1+ Posterior capsular opacification   Anterior Vitreous Vitreous syneresis Vitreous syneresis         Fundus Exam       Right Left   Disc Pink and Sharp, Compact, temporal PPA, peripapillary CNV with edema superior disc -- stably improved, no heme mild Pallor, Sharp rim, PPA   C/D Ratio 0.5 0.6   Macula Flat, Blunted foveal reflex, fine Drusen, RPE mottling and clumping, No heme or edema  Blunted foveal reflex, central CNV with edema/cystic changes -, Drusen, RPE mottling, no heme   Vessels attenuated, Tortuous attenuated, Tortuous   Periphery Attached, mild reticular degeneration, No heme Attached, mild reticular degeneration, No heme, focal peripheral drusen temporal periphery           IMAGING AND PROCEDURES  Imaging and Procedures for 09/19/2023  OCT, Retina - OU - Both Eyes       Right Eye Quality was good. Central Foveal Thickness: 293. Progression has been stable. Findings include normal foveal contour, no IRF, no SRF, retinal drusen , subretinal hyper-reflective material, epiretinal membrane, pigment epithelial detachment (stable improvement in peripapillary IRF overlying stable low PED superior disc, Mild ERM).   Left Eye Quality was good. Central Foveal Thickness: 280. Progression has been stable. Findings include normal foveal contour, no SRF, subretinal hyper-reflective material, intraretinal hyper-reflective material, pigment epithelial detachment, subretinal fluid (stable resolution of central SRF, persistent IRF/cystic changes, stable central PED).   Notes *Images captured and stored on drive  Diagnosis / Impression:  Exu ARMD OU OD: stable improvement in peripapillary IRF overlying stable low PED superior disc, Mild ERM OS: stable resolution of central SRF, persistent IRF/cystic changes, stable central PED  Clinical management:  See below  Abbreviations: NFP - Normal foveal profile. CME - cystoid macular edema. PED - pigment epithelial detachment. IRF - intraretinal fluid. SRF - subretinal fluid. EZ - ellipsoid zone. ERM - epiretinal membrane. ORA - outer retinal atrophy. ORT - outer retinal tubulation. SRHM - subretinal hyper-reflective material. IRHM - intraretinal hyper-reflective material      Intravitreal Injection, Pharmacologic Agent - OS - Left Eye       Time Out 09/19/2023. 9:48 AM. Confirmed correct patient, procedure, site, and  patient consented.   Anesthesia Topical anesthesia was used. Anesthetic medications included Lidocaine 2%, Proparacaine 0.5%.   Procedure Preparation included 5% betadine to ocular surface, eyelid speculum. A (32g) needle was used.   Injection: 2 mg aflibercept 2 MG/0.05ML   Route: Intravitreal, Site: Left Eye   NDC: L6038910, Lot: [id number], Expiration date: 11/03/2024, Waste: 0 mL   Post-op Post injection exam found visual acuity of at least counting fingers. The patient tolerated the procedure well. There were no complications. The patient received written and verbal post procedure care education. Post injection medications were not given.             ASSESSMENT/PLAN:  ICD-10-CM   1. Exudative age-related macular degeneration of both eyes with active choroidal neovascularization (HCC)  H35.3231 OCT, Retina - OU - Both Eyes    Intravitreal Injection, Pharmacologic Agent - OS - Left Eye    aflibercept (EYLEA) SOLN 2 mg    2. Essential hypertension  I10     3. Hypertensive retinopathy of both eyes  H35.033     4. Pseudophakia, both eyes  Z96.1       Exudative age related macular degeneration, OU  - pt delayed to follow up from 4 weeks to 8 on 11.14.23  - OD: small peripapillary CNV  - OS: central CNV - FA (09.13.23) shows OD: Focal peripapillary CNV with leakage superior disc; OS: Central CNV with central blockage from heme, +leakage - s/p IVA OD #1 (09.13.23), #2 (11.14.23), #3 (12.12.23), #4 (01.09.24), #5 (02.06.24) - s/p IVA OS #1 (08.16.23), #2 (09.13.23), #3 (11.14.23), #4 (12.12.23), #5 (01.09.24) -- IVA resistance - s/p IVE OS #1 (02.06.24), #2 (03.12.24), #3 (04.16.24), #4 (05.28.24), #5 (07.16.24), # (08.29.24)  **h/o increased edema at 7 wks noted on 07.16.24 exam**  - BCVA 20/20 OU  - stable  - OCT shows OD: stable improvement in peripapillary IRF overlying stable low PED superior disc at 8+ mos since last injxn (IVA OD #5); [id number] resolution of  central SRF, persistent IRF/cystic changes, stable central PED at 6 weeks  - recommend IVE OS #7 today, 10.15.24 w/ f/u in 6 wks - will hold off on OD again today - pt wishes to proceed with injection - RBA of procedure discussed, questions answered - IVE informed consent obtained and signed, 02.06.24 (OU) - see procedure note - approved for Eylea   - f/u in 6 wks -- DFE/OCT/possible injection(s)  2,3. Hypertensive retinopathy OU - discussed importance of tight BP control - continue to monitor  4. Pseudophakia OU  - s/p CE/IOL OU (Dr. Elmer Picker)  - IOLs in good position, doing well  - continue to monitor  Ophthalmic Meds Ordered this visit:  Meds ordered this encounter  Medications   aflibercept (EYLEA) SOLN 2 mg     Return in about 6 weeks (around 10/31/2023) for f/u Ex. AMD OU, Possible, IVE, OS.  There are no Patient Instructions on file for this visit.  Explained the diagnoses, plan, and follow up with the patient and they expressed understanding.  Patient expressed understanding of the importance of proper follow up care.   This document serves as a record of services personally performed by Karie Chimera, MD, PhD. It was created on their behalf by Laurey Morale, COT an ophthalmic technician. The creation of this record is the provider's dictation and/or activities during the visit.    Electronically signed by:  Charlette Caffey, COT  09/19/23 11:50 PM   Karie Chimera, M.D., Ph.D. Diseases & Surgery of the Retina and Vitreous [facility]  I have reviewed the above documentation for accuracy and completeness, and I agree with the above. Karie Chimera, M.D., Ph.D. 09/19/23 11:52 PM   Abbreviations: M myopia (nearsighted); A astigmatism; H hyperopia (farsighted); P presbyopia; Mrx spectacle prescription;  CTL contact lenses; OD right eye; OS left eye; OU both eyes  XT exotropia; ET esotropia; PEK punctate epithelial keratitis; PEE punctate  epithelial erosions; DES dry eye syndrome; MGD meibomian gland dysfunction; ATs artificial tears; PFAT's preservative free artificial tears; NSC nuclear sclerotic cataract; PSC posterior subcapsular cataract; ERM epi-retinal membrane; PVD posterior vitreous detachment; RD retinal  detachment; DM diabetes mellitus; DR diabetic retinopathy; NPDR non-proliferative diabetic retinopathy; PDR proliferative diabetic retinopathy; CSME clinically significant macular edema; DME diabetic macular edema; dbh dot blot hemorrhages; CWS cotton wool spot; POAG primary open angle glaucoma; C/D cup-to-disc ratio; HVF humphrey visual field; GVF goldmann visual field; OCT optical coherence tomography; IOP intraocular pressure; BRVO Branch retinal vein occlusion; CRVO central retinal vein occlusion; CRAO central retinal artery occlusion; BRAO branch retinal artery occlusion; RT retinal tear; SB scleral buckle; PPV pars plana vitrectomy; VH Vitreous hemorrhage; PRP panretinal laser photocoagulation; IVK intravitreal kenalog; VMT vitreomacular traction; MH Macular hole;  NVD neovascularization of the disc; NVE neovascularization elsewhere; AREDS age related eye disease study; ARMD age related macular degeneration; POAG primary open angle glaucoma; EBMD epithelial/anterior basement membrane dystrophy; ACIOL anterior chamber intraocular lens; IOL intraocular lens; PCIOL posterior chamber intraocular lens; Phaco/IOL phacoemulsification with intraocular lens placement; PRK photorefractive keratectomy; LASIK laser assisted in situ keratomileusis; HTN hypertension; DM diabetes mellitus; COPD chronic obstructive pulmonary disease

## 2023-09-14 ENCOUNTER — Encounter (INDEPENDENT_AMBULATORY_CARE_PROVIDER_SITE_OTHER): Payer: Medicare Other | Admitting: Ophthalmology

## 2023-09-19 ENCOUNTER — Ambulatory Visit (INDEPENDENT_AMBULATORY_CARE_PROVIDER_SITE_OTHER): Payer: Medicare Other | Admitting: Ophthalmology

## 2023-09-19 ENCOUNTER — Encounter (INDEPENDENT_AMBULATORY_CARE_PROVIDER_SITE_OTHER): Payer: Self-pay | Admitting: Ophthalmology

## 2023-09-19 DIAGNOSIS — I10 Essential (primary) hypertension: Secondary | ICD-10-CM | POA: Diagnosis not present

## 2023-09-19 DIAGNOSIS — Z961 Presence of intraocular lens: Secondary | ICD-10-CM | POA: Diagnosis not present

## 2023-09-19 DIAGNOSIS — H353231 Exudative age-related macular degeneration, bilateral, with active choroidal neovascularization: Secondary | ICD-10-CM | POA: Diagnosis not present

## 2023-09-19 DIAGNOSIS — H35033 Hypertensive retinopathy, bilateral: Secondary | ICD-10-CM

## 2023-09-19 MED ORDER — AFLIBERCEPT 2MG/0.05ML IZ SOLN FOR KALEIDOSCOPE
2.0000 mg | INTRAVITREAL | Status: AC | PRN
Start: 2023-09-19 — End: 2023-09-19
  Administered 2023-09-19: 2 mg via INTRAVITREAL

## 2023-10-24 NOTE — Progress Notes (Shared)
[facility] - Clinic Note  11/07/2023    CHIEF COMPLAINT Patient presents for No chief complaint on file.   HISTORY OF PRESENT ILLNESS: Audrey Sosa is a 89 y.o. female who presents to the clinic today for:     Patient states the vision is the same.  Referring physician: Georgann Housekeeper, MD [address] [place],  [place] [address]  HISTORICAL INFORMATION:   Selected notes from the MEDICAL RECORD NUMBER Referred by Dr. Zenaida Niece for ex ARMD LEE:  Ocular Hx- PMH-    CURRENT MEDICATIONS: No current outpatient medications on file. (Ophthalmic Drugs)   No current facility-administered medications for this visit. (Ophthalmic Drugs)   Current Outpatient Medications (Other)  Medication Sig   Cholecalciferol (VITAMIN D3) 1.25 MG (50000 UT) CAPS Take by mouth as directed.   Multiple Vitamins-Minerals (MULTIVITAMIN ADULT EXTRA C PO)    raloxifene (EVISTA) 60 MG tablet Take 60 mg by mouth daily.   simvastatin (ZOCOR) 20 MG tablet Take 20 mg by mouth daily.   valsartan-hydrochlorothiazide (DIOVAN-HCT) 160-12.5 MG tablet Take by mouth as directed.   No current facility-administered medications for this visit. (Other)   REVIEW OF SYSTEMS:     ALLERGIES Not on File  PAST MEDICAL HISTORY No past medical history on file. Past Surgical History:  Procedure Laterality Date   CATARACT EXTRACTION     FAMILY HISTORY Family History  Problem Relation Age of Onset   Macular degeneration Mother    Macular degeneration Father    SOCIAL HISTORY Social History   Tobacco Use   Smoking status: Never   Smokeless tobacco: Never  Vaping Use   Vaping status: Never Used       OPHTHALMIC EXAM:  Not recorded    IMAGING AND PROCEDURES  Imaging and Procedures for 11/07/2023           ASSESSMENT/PLAN:    ICD-10-CM   1. Exudative age-related macular degeneration of both eyes with active choroidal neovascularization (HCC)  H35.3231     2.  Essential hypertension  I10     3. Hypertensive retinopathy of both eyes  H35.033     4. Pseudophakia, both eyes  Z96.1        Exudative age related macular degeneration, OU  - pt delayed to follow up from 4 weeks to 8 on 11.14.23  - OD: small peripapillary CNV  - OS: central CNV - FA (09.13.23) shows OD: Focal peripapillary CNV with leakage superior disc; OS: Central CNV with central blockage from heme, +leakage - s/p IVA OD #1 (09.13.23), #2 (11.14.23), #3 (12.12.23), #4 (01.09.24), #5 (02.06.24) - s/p IVA OS #1 (08.16.23), #2 (09.13.23), #3 (11.14.23), #4 (12.12.23), #5 (01.09.24) -- IVA resistance - s/p IVE OS #1 (02.06.24), #2 (03.12.24), #3 (04.16.24), #4 (05.28.24), #5 (07.16.24), # (08.29.24), #7 (10.15.24)  **h/o increased edema at 7 wks noted on 07.16.24 exam**  - BCVA 20/20 OU  - stable  - OCT shows OD: stable improvement in peripapillary IRF overlying stable low PED superior disc at 8+ mos since last injxn (IVA OD #5); [id number] resolution of central SRF, persistent IRF/cystic changes, stable central PED at 6 weeks  - recommend IVE OS #8 today, 12.03.24 w/ f/u in 6 wks - will hold off on OD again today - pt wishes to proceed with injection - RBA of procedure discussed, questions answered - IVE informed consent obtained and signed, 02.06.24 (OU) - see procedure note - approved for Eylea   - f/u  in 6 wks -- DFE/OCT/possible injection(s)  2,3. Hypertensive retinopathy OU - discussed importance of tight BP control - continue to monitor  4. Pseudophakia OU  - s/p CE/IOL OU (Dr. Elmer Picker)  - IOLs in good position, doing well  - continue to monitor  Ophthalmic Meds Ordered this visit:  No orders of the defined types were placed in this encounter.    No follow-ups on file.  There are no Patient Instructions on file for this visit.  Explained the diagnoses, plan, and follow up with the patient and they expressed understanding.  Patient expressed understanding of the  importance of proper follow up care.   This document serves as a record of services personally performed by Karie Chimera, MD, PhD. It was created on their behalf by Laurey Morale, COT an ophthalmic technician. The creation of this record is the provider's dictation and/or activities during the visit.    Electronically signed by:  Charlette Caffey, COT  10/24/23 12:06 PM   Karie Chimera, M.D., Ph.D. Diseases & Surgery of the Retina and Vitreous [facility]     Abbreviations: M myopia (nearsighted); A astigmatism; H hyperopia (farsighted); P presbyopia; Mrx spectacle prescription;  CTL contact lenses; OD right eye; OS left eye; OU both eyes  XT exotropia; ET esotropia; PEK punctate epithelial keratitis; PEE punctate epithelial erosions; DES dry eye syndrome; MGD meibomian gland dysfunction; ATs artificial tears; PFAT's preservative free artificial tears; NSC nuclear sclerotic cataract; PSC posterior subcapsular cataract; ERM epi-retinal membrane; PVD posterior vitreous detachment; RD retinal detachment; DM diabetes mellitus; DR diabetic retinopathy; NPDR non-proliferative diabetic retinopathy; PDR proliferative diabetic retinopathy; CSME clinically significant macular edema; DME diabetic macular edema; dbh dot blot hemorrhages; CWS cotton wool spot; POAG primary open angle glaucoma; C/D cup-to-disc ratio; HVF humphrey visual field; GVF goldmann visual field; OCT optical coherence tomography; IOP intraocular pressure; BRVO Branch retinal vein occlusion; CRVO central retinal vein occlusion; CRAO central retinal artery occlusion; BRAO branch retinal artery occlusion; RT retinal tear; SB scleral buckle; PPV pars plana vitrectomy; VH Vitreous hemorrhage; PRP panretinal laser photocoagulation; IVK intravitreal kenalog; VMT vitreomacular traction; MH Macular hole;  NVD neovascularization of the disc; NVE neovascularization elsewhere; AREDS age related eye disease study; ARMD  age related macular degeneration; POAG primary open angle glaucoma; EBMD epithelial/anterior basement membrane dystrophy; ACIOL anterior chamber intraocular lens; IOL intraocular lens; PCIOL posterior chamber intraocular lens; Phaco/IOL phacoemulsification with intraocular lens placement; PRK photorefractive keratectomy; LASIK laser assisted in situ keratomileusis; HTN hypertension; DM diabetes mellitus; COPD chronic obstructive pulmonary disease

## 2023-10-31 ENCOUNTER — Encounter (INDEPENDENT_AMBULATORY_CARE_PROVIDER_SITE_OTHER): Payer: Medicare Other | Admitting: Ophthalmology

## 2023-11-07 ENCOUNTER — Encounter (INDEPENDENT_AMBULATORY_CARE_PROVIDER_SITE_OTHER): Payer: Medicare Other | Admitting: Ophthalmology

## 2023-11-07 DIAGNOSIS — H35033 Hypertensive retinopathy, bilateral: Secondary | ICD-10-CM

## 2023-11-07 DIAGNOSIS — I10 Essential (primary) hypertension: Secondary | ICD-10-CM

## 2023-11-07 DIAGNOSIS — H353231 Exudative age-related macular degeneration, bilateral, with active choroidal neovascularization: Secondary | ICD-10-CM

## 2023-11-07 DIAGNOSIS — Z961 Presence of intraocular lens: Secondary | ICD-10-CM

## 2023-11-08 NOTE — Progress Notes (Signed)
[facility] - Clinic Note  11/14/2023    CHIEF COMPLAINT Patient presents for Retina Follow Up   HISTORY OF PRESENT ILLNESS: Audrey Sosa is a 89 y.o. female who presents to the clinic today for:   HPI     Retina Follow Up   Patient presents with  Wet AMD.  In both eyes.  This started 6 weeks ago.  Duration of 6 weeks.  Since onset it is stable.  I, the attending physician,  performed the HPI with the patient and updated documentation appropriately.        Comments   6 week retina follow up AMD OU and IVE OS pt is reporting no vision changes noticed she denies any flashes or floaters       Last edited by Rennis Chris, MD on 11/14/2023 12:20 PM.    Pt is delayed from 6 weeks to 8 weeks due to having to go out of town, she  states the vision is the same  Referring physician: Georgann Housekeeper, MD [address] [place],  [place] [address]  HISTORICAL INFORMATION:   Selected notes from the MEDICAL RECORD NUMBER Referred by Dr. Zenaida Niece for ex ARMD LEE:  Ocular Hx- PMH-    CURRENT MEDICATIONS: No current outpatient medications on file. (Ophthalmic Drugs)   No current facility-administered medications for this visit. (Ophthalmic Drugs)   Current Outpatient Medications (Other)  Medication Sig   Cholecalciferol (VITAMIN D3) 1.25 MG (50000 UT) CAPS Take by mouth as directed.   Multiple Vitamins-Minerals (MULTIVITAMIN ADULT EXTRA C PO)    raloxifene (EVISTA) 60 MG tablet Take 60 mg by mouth daily.   simvastatin (ZOCOR) 20 MG tablet Take 20 mg by mouth daily.   valsartan-hydrochlorothiazide (DIOVAN-HCT) 160-12.5 MG tablet Take by mouth as directed.   No current facility-administered medications for this visit. (Other)   REVIEW OF SYSTEMS: ROS   Positive for: Eyes Negative for: Constitutional, Gastrointestinal, Neurological, Skin, Genitourinary, Musculoskeletal, HENT, Endocrine, Cardiovascular, Respiratory, Psychiatric, Allergic/Imm,  Heme/Lymph Last edited by Etheleen Mayhew, COT on 11/14/2023  8:44 AM.        ALLERGIES Not on File  PAST MEDICAL HISTORY History reviewed. No pertinent past medical history. Past Surgical History:  Procedure Laterality Date   CATARACT EXTRACTION     FAMILY HISTORY Family History  Problem Relation Age of Onset   Macular degeneration Mother    Macular degeneration Father    SOCIAL HISTORY Social History   Tobacco Use   Smoking status: Never   Smokeless tobacco: Never  Vaping Use   Vaping status: Never Used       OPHTHALMIC EXAM:  Base Eye Exam     Visual Acuity (Snellen - Linear)       Right Left   Dist [place] 20/20 -2 20/25 -3   Dist ph [place]  NI         Tonometry (Tonopen, 8:49 AM)       Right Left   Pressure 14 16         Pupils       Pupils Dark Light Shape React APD   Right PERRL 3 2 Round Brisk None   Left PERRL 3 2 Round Brisk None         Visual Fields       Left Right    Full Full         Extraocular Movement       Right Left  Full, Ortho Full, Ortho         Neuro/Psych     Oriented x3: Yes   Mood/Affect: Normal         Dilation     Both eyes: 2.5% Phenylephrine @ 8:49 AM           Slit Lamp and Fundus Exam     Slit Lamp Exam       Right Left   Lids/Lashes Dermatochalasis - upper lid Dermatochalasis - upper lid   Conjunctiva/Sclera White and quiet White and quiet   Cornea arcus, trace PEE, mild tear film debris arcus, trace PEE, mild tear film debris   Anterior Chamber Deep and quiet Deep and quiet   Iris Round and dilated Round and dilated   Lens PC IOL in good position, trace Posterior capsular opacification PC IOL in good position, 1+ Posterior capsular opacification   Anterior Vitreous Vitreous syneresis Vitreous syneresis         Fundus Exam       Right Left   Disc Pink and Sharp, Compact, temporal PPA, peripapillary CNV with edema superior disc -- stably improved, no heme mild Pallor,  Sharp rim, PPA   C/D Ratio 0.5 0.6   Macula Flat, Blunted foveal reflex, fine Drusen, RPE mottling and clumping, No heme or edema Blunted foveal reflex, central CNV with edema/cystic changes -- slightly increased, Drusen, RPE mottling, no heme   Vessels attenuated, Tortuous attenuated, Tortuous   Periphery Attached, mild reticular degeneration, No heme Attached, mild reticular degeneration, No heme, focal peripheral drusen temporal periphery           IMAGING AND PROCEDURES  Imaging and Procedures for 11/14/2023  OCT, Retina - OU - Both Eyes       Right Eye Quality was good. Central Foveal Thickness: 293. Progression has been stable. Findings include normal foveal contour, no IRF, no SRF, retinal drusen , subretinal hyper-reflective material, epiretinal membrane, pigment epithelial detachment (stable improvement in peripapillary IRF overlying stable low PED superior disc, Mild ERM).   Left Eye Quality was good. Central Foveal Thickness: 325. Progression has worsened. Findings include normal foveal contour, no SRF, subretinal hyper-reflective material, intraretinal hyper-reflective material, pigment epithelial detachment, subretinal fluid (stable resolution of central SRF overlying stable central PED, interval increase in IRF/cystic changes).   Notes *Images captured and stored on drive  Diagnosis / Impression:  Exu ARMD OU OD: stable improvement in peripapillary IRF overlying stable low PED superior disc, Mild ERM OS: stable resolution of central SRF overlying stable central PED, interval increase in IRF/cystic changes  Clinical management:  See below  Abbreviations: NFP - Normal foveal profile. CME - cystoid macular edema. PED - pigment epithelial detachment. IRF - intraretinal fluid. SRF - subretinal fluid. EZ - ellipsoid zone. ERM - epiretinal membrane. ORA - outer retinal atrophy. ORT - outer retinal tubulation. SRHM - subretinal hyper-reflective material. IRHM - intraretinal  hyper-reflective material      Intravitreal Injection, Pharmacologic Agent - OS - Left Eye       Time Out 11/14/2023. 10:18 AM. Confirmed correct patient, procedure, site, and patient consented.   Anesthesia Topical anesthesia was used. Anesthetic medications included Lidocaine 2%, Proparacaine 0.5%.   Procedure Preparation included 5% betadine to ocular surface, eyelid speculum. A (32g) needle was used.   Injection: 2 mg aflibercept 2 MG/0.05ML   Route: Intravitreal, Site: Left Eye   NDC: L6038910, Lot: [id number], Expiration date: 05/04/2024, Waste: 0 mL   Post-op Post injection exam found visual acuity  of at least counting fingers. The patient tolerated the procedure well. There were no complications. The patient received written and verbal post procedure care education. Post injection medications were not given.            ASSESSMENT/PLAN:    ICD-10-CM   1. Exudative age-related macular degeneration of both eyes with active choroidal neovascularization (HCC)  H35.3231 OCT, Retina - OU - Both Eyes    Intravitreal Injection, Pharmacologic Agent - OS - Left Eye    aflibercept (EYLEA) SOLN 2 mg    2. Essential hypertension  I10     3. Hypertensive retinopathy of both eyes  H35.033     4. Pseudophakia, both eyes  Z96.1      Exudative age related macular degeneration, OU  - pt delayed to follow up from 4 weeks to 8 on 11.14.23  - pt delayed to follow up from 6 weeks to 8 on 12.10.24  - OD: small peripapillary CNV -- stably improved  - OS: central CNV - FA (09.13.23) shows OD: Focal peripapillary CNV with leakage superior disc; OS: Central CNV with central blockage from heme, +leakage - s/p IVA OD #1 (09.13.23), #2 (11.14.23), #3 (12.12.23), #4 (01.09.24), #5 (02.06.24) - s/p IVA OS #1 (08.16.23), #2 (09.13.23), #3 (11.14.23), #4 (12.12.23), #5 (01.09.24) -- IVA resistance - s/p IVE OS #1 (02.06.24), #2 (03.12.24), #3 (04.16.24), #4 (05.28.24), #5 (07.16.24), #  (08.29.24), #7 (10.15.24)  **h/o increased edema at 7 wks noted on 07.16.24 exam**  - BCVA 20/20 OU  - stable  - OCT shows OD: stable improvement in peripapillary IRF overlying stable low PED superior disc at 10 mos since last injxn (IVA OD #5); WG:NFAOZH resolution of central SRF overlying stable central PED, interval increase in IRF/cystic changes at 8 weeks  - recommend IVE OS #8 today, 12.10.24 w/ f/u back to 6 wks - will hold off on OD again today - pt wishes to proceed with injection - RBA of procedure discussed, questions answered - IVE informed consent obtained and signed, 02.06.24 (OU) - see procedure note - approved for Eylea   - f/u in 6 wks -- DFE/OCT/possible injection(s)  2,3. Hypertensive retinopathy OU - discussed importance of tight BP control - continue to monitor  4. Pseudophakia OU  - s/p CE/IOL OU (Dr. Elmer Picker)  - IOLs in good position, doing well  - continue to monitor  Ophthalmic Meds Ordered this visit:  Meds ordered this encounter  Medications   aflibercept (EYLEA) SOLN 2 mg     Return in about 6 weeks (around 12/26/2023) for f/u exu ARMD OU, DFE, OCT.  There are no Patient Instructions on file for this visit.  Explained the diagnoses, plan, and follow up with the patient and they expressed understanding.  Patient expressed understanding of the importance of proper follow up care.   This document serves as a record of services personally performed by Karie Chimera, MD, PhD. It was created on their behalf by Laurey Morale, COT an ophthalmic technician. The creation of this record is the provider's dictation and/or activities during the visit.    Electronically signed by:  Charlette Caffey, COT  11/14/23 12:23 PM  This document serves as a record of services personally performed by Karie Chimera, MD, PhD. It was created on their behalf by Glee Arvin. Manson Passey, OA an ophthalmic technician. The creation of this record is the provider's dictation and/or  activities during the visit.    Electronically signed by: Glee Arvin. Manson Passey,  OA 11/14/23 12:23 PM  Karie Chimera, M.D., Ph.D. Diseases & Surgery of the Retina and Vitreous [facility]  I have reviewed the above documentation for accuracy and completeness, and I agree with the above. Karie Chimera, M.D., Ph.D. 11/14/23 12:23 PM  Abbreviations: M myopia (nearsighted); A astigmatism; H hyperopia (farsighted); P presbyopia; Mrx spectacle prescription;  CTL contact lenses; OD right eye; OS left eye; OU both eyes  XT exotropia; ET esotropia; PEK punctate epithelial keratitis; PEE punctate epithelial erosions; DES dry eye syndrome; MGD meibomian gland dysfunction; ATs artificial tears; PFAT's preservative free artificial tears; NSC nuclear sclerotic cataract; PSC posterior subcapsular cataract; ERM epi-retinal membrane; PVD posterior vitreous detachment; RD retinal detachment; DM diabetes mellitus; DR diabetic retinopathy; NPDR non-proliferative diabetic retinopathy; PDR proliferative diabetic retinopathy; CSME clinically significant macular edema; DME diabetic macular edema; dbh dot blot hemorrhages; CWS cotton wool spot; POAG primary open angle glaucoma; C/D cup-to-disc ratio; HVF humphrey visual field; GVF goldmann visual field; OCT optical coherence tomography; IOP intraocular pressure; BRVO Branch retinal vein occlusion; CRVO central retinal vein occlusion; CRAO central retinal artery occlusion; BRAO branch retinal artery occlusion; RT retinal tear; SB scleral buckle; PPV pars plana vitrectomy; VH Vitreous hemorrhage; PRP panretinal laser photocoagulation; IVK intravitreal kenalog; VMT vitreomacular traction; MH Macular hole;  NVD neovascularization of the disc; NVE neovascularization elsewhere; AREDS age related eye disease study; ARMD age related macular degeneration; POAG primary open angle glaucoma; EBMD epithelial/anterior basement membrane dystrophy; ACIOL anterior chamber  intraocular lens; IOL intraocular lens; PCIOL posterior chamber intraocular lens; Phaco/IOL phacoemulsification with intraocular lens placement; PRK photorefractive keratectomy; LASIK laser assisted in situ keratomileusis; HTN hypertension; DM diabetes mellitus; COPD chronic obstructive pulmonary disease

## 2023-11-14 ENCOUNTER — Ambulatory Visit (INDEPENDENT_AMBULATORY_CARE_PROVIDER_SITE_OTHER): Payer: Medicare Other | Admitting: Ophthalmology

## 2023-11-14 ENCOUNTER — Encounter (INDEPENDENT_AMBULATORY_CARE_PROVIDER_SITE_OTHER): Payer: Self-pay | Admitting: Ophthalmology

## 2023-11-14 DIAGNOSIS — H353231 Exudative age-related macular degeneration, bilateral, with active choroidal neovascularization: Secondary | ICD-10-CM | POA: Diagnosis not present

## 2023-11-14 DIAGNOSIS — H35033 Hypertensive retinopathy, bilateral: Secondary | ICD-10-CM | POA: Diagnosis not present

## 2023-11-14 DIAGNOSIS — Z961 Presence of intraocular lens: Secondary | ICD-10-CM | POA: Diagnosis not present

## 2023-11-14 DIAGNOSIS — I10 Essential (primary) hypertension: Secondary | ICD-10-CM | POA: Diagnosis not present

## 2023-11-14 MED ORDER — AFLIBERCEPT 2MG/0.05ML IZ SOLN FOR KALEIDOSCOPE
2.0000 mg | INTRAVITREAL | Status: AC | PRN
Start: 2023-11-14 — End: 2023-11-14
  Administered 2023-11-14: 2 mg via INTRAVITREAL

## 2023-12-12 NOTE — Progress Notes (Signed)
[facility] - Clinic Note  12/26/2023    CHIEF COMPLAINT Patient presents for Retina Follow Up   HISTORY OF PRESENT ILLNESS: Audrey Sosa is a 89 y.o. female who presents to the clinic today for:   HPI     Retina Follow Up   Patient presents with  Wet AMD.  In both eyes.  This started 6.  Duration of 6 weeks.  Since onset it is stable.  I, the attending physician,  performed the HPI with the patient and updated documentation appropriately.        Comments   6 week retina follow up I'VE OU OS pt is reporting no vision changes noticed she denies any flashes or floaters       Last edited by Rennis Chris, MD on 12/29/2023  4:45 PM.     Referring physician: Georgann Housekeeper, MD [address] [place],  [place] [address]  HISTORICAL INFORMATION:   Selected notes from the MEDICAL RECORD NUMBER Referred by Dr. Zenaida Niece for ex ARMD LEE:  Ocular Hx- PMH-    CURRENT MEDICATIONS: No current outpatient medications on file. (Ophthalmic Drugs)   No current facility-administered medications for this visit. (Ophthalmic Drugs)   Current Outpatient Medications (Other)  Medication Sig   Cholecalciferol (VITAMIN D3) 1.25 MG (50000 UT) CAPS Take by mouth as directed.   Multiple Vitamins-Minerals (MULTIVITAMIN ADULT EXTRA C PO)    raloxifene (EVISTA) 60 MG tablet Take 60 mg by mouth daily.   simvastatin (ZOCOR) 20 MG tablet Take 20 mg by mouth daily.   valsartan-hydrochlorothiazide (DIOVAN-HCT) 160-12.5 MG tablet Take by mouth as directed.   No current facility-administered medications for this visit. (Other)   REVIEW OF SYSTEMS: ROS   Positive for: Eyes Negative for: Constitutional, Gastrointestinal, Neurological, Skin, Genitourinary, Musculoskeletal, HENT, Endocrine, Cardiovascular, Respiratory, Psychiatric, Allergic/Imm, Heme/Lymph Last edited by Etheleen Mayhew, COT on 12/26/2023  9:27 AM.         ALLERGIES Not on File  PAST MEDICAL  HISTORY History reviewed. No pertinent past medical history. Past Surgical History:  Procedure Laterality Date   CATARACT EXTRACTION     FAMILY HISTORY Family History  Problem Relation Age of Onset   Macular degeneration Mother    Macular degeneration Father    SOCIAL HISTORY Social History   Tobacco Use   Smoking status: Never   Smokeless tobacco: Never  Vaping Use   Vaping status: Never Used       OPHTHALMIC EXAM:  Base Eye Exam     Visual Acuity (Snellen - Linear)       Right Left   Dist [place] 20/20 -3 20/30 -2   Dist ph   20/25 -3         Tonometry (Tonopen, 9:32 AM)       Right Left   Pressure 16 17         Pupils       Pupils Dark Light Shape React APD   Right PERRL 3 2 Round Brisk None   Left PERRL 3 2 Round Brisk None         Visual Fields       Left Right    Full Full         Extraocular Movement       Right Left    Full, Ortho Full, Ortho         Neuro/Psych     Oriented x3: Yes   Mood/Affect: Normal  Dilation     Both eyes: 2.5% Phenylephrine @ 9:32 AM           Slit Lamp and Fundus Exam     Slit Lamp Exam       Right Left   Lids/Lashes Dermatochalasis - upper lid Dermatochalasis - upper lid   Conjunctiva/Sclera White and quiet White and quiet   Cornea arcus, trace PEE, mild tear film debris arcus, trace PEE, mild tear film debris   Anterior Chamber Deep and quiet Deep and quiet   Iris Round and dilated Round and dilated   Lens PC IOL in good position, trace Posterior capsular opacification PC IOL in good position, 1+ Posterior capsular opacification   Anterior Vitreous Vitreous syneresis Vitreous syneresis         Fundus Exam       Right Left   Disc Pink and Sharp, Compact, temporal PPA, peripapillary CNV with edema superior disc -- stably improved, no heme mild Pallor, Sharp rim, PPA   C/D Ratio 0.5 0.6   Macula Flat, Blunted foveal reflex, fine Drusen, RPE mottling and clumping, No heme or  edema Blunted foveal reflex, central CNV with edema/cystic changes -- slightly improved, Drusen, RPE mottling, no heme   Vessels attenuated, Tortuous attenuated, Tortuous   Periphery Attached, mild reticular degeneration, No heme Attached, mild reticular degeneration, No heme, focal peripheral drusen temporal periphery           IMAGING AND PROCEDURES  Imaging and Procedures for 12/26/2023  OCT, Retina - OU - Both Eyes       Right Eye Quality was poor. Central Foveal Thickness: 298. Progression has been stable. Findings include normal foveal contour, no IRF, no SRF, retinal drusen , subretinal hyper-reflective material, epiretinal membrane, pigment epithelial detachment (stable improvement in peripapillary IRF overlying stable low PED superior disc, Mild ERM).   Left Eye Quality was good. Central Foveal Thickness: 266. Progression has improved. Findings include normal foveal contour, no SRF, subretinal hyper-reflective material, intraretinal hyper-reflective material, pigment epithelial detachment, subretinal fluid (stable resolution of central SRF overlying stable central PED, interval improvement in IRF/cystic changes).   Notes *Images captured and stored on drive  Diagnosis / Impression:  Exu ARMD OU OD: stable improvement in peripapillary IRF overlying stable low PED superior disc, Mild ERM OS: stable resolution of central SRF overlying stable central PED, interval improvement in IRF/cystic changes  Clinical management:  See below  Abbreviations: NFP - Normal foveal profile. CME - cystoid macular edema. PED - pigment epithelial detachment. IRF - intraretinal fluid. SRF - subretinal fluid. EZ - ellipsoid zone. ERM - epiretinal membrane. ORA - outer retinal atrophy. ORT - outer retinal tubulation. SRHM - subretinal hyper-reflective material. IRHM - intraretinal hyper-reflective material      Intravitreal Injection, Pharmacologic Agent - OS - Left Eye       Time  Out 12/26/2023. 11:31 AM. Confirmed correct patient, procedure, site, and patient consented.   Anesthesia Topical anesthesia was used. Anesthetic medications included Lidocaine 2%, Proparacaine 0.5%.   Procedure Preparation included 5% betadine to ocular surface, eyelid speculum. A (32g) needle was used.   Injection: 2 mg aflibercept 2 MG/0.05ML   Route: Intravitreal, Site: Left Eye   NDC: L6038910, Lot: [id number], Expiration date: 04/03/2024, Waste: 0 mL   Post-op Post injection exam found visual acuity of at least counting fingers. The patient tolerated the procedure well. There were no complications. The patient received written and verbal post procedure care education. Post injection medications were not given.  Notes **SAMPLE MEDICATION ADMINISTERED**           ASSESSMENT/PLAN:    ICD-10-CM   1. Exudative age-related macular degeneration of both eyes with active choroidal neovascularization (HCC)  H35.3231 OCT, Retina - OU - Both Eyes    Intravitreal Injection, Pharmacologic Agent - OS - Left Eye    aflibercept (EYLEA) SOLN 2 mg    2. Essential hypertension  I10     3. Hypertensive retinopathy of both eyes  H35.033     4. Pseudophakia, both eyes  Z96.1      Exudative age related macular degeneration, OU  - pt delayed to follow up from 4 weeks to 8 on 11.14.23  - pt delayed to follow up from 6 weeks to 8 on 12.10.24  - OD: small peripapillary CNV -- stably improved  - OS: central CNV - FA (09.13.23) shows OD: Focal peripapillary CNV with leakage superior disc; OS: Central CNV with central blockage from heme, +leakage - s/p IVA OD #1 (09.13.23), #2 (11.14.23), #3 (12.12.23), #4 (01.09.24), #5 (02.06.24) - s/p IVA OS #1 (08.16.23), #2 (09.13.23), #3 (11.14.23), #4 (12.12.23), #5 (01.09.24) -- IVA resistance - s/p IVE OS #1 (02.06.24), #2 (03.12.24), #3 (04.16.24), #4 (05.28.24), #5 (07.16.24), # (08.29.24), #7 (10.15.24), #8 (12.10.24)  **h/o increased edema at 7  wks noted on 07.16.24 exam**  - BCVA OD 20/20 -- stable; OS 20/25 -- stable - OCT shows OD: stable improvement in peripapillary IRF overlying stable low PED superior disc at 11+ mos since last injxn (IVA OD #5); OS: stable resolution of central SRF overlying stable central PED, interval improvement in IRF/cystic changes at 6 weeks  - recommend IVE OS #9 [SAMPLE] today, 1.21.25 w/ f/u at 6 wks -- [employer] funding unavailable - will hold off on OD again today - pt wishes to proceed with injection OS - RBA of procedure discussed, questions answered - IVE informed consent obtained and signed, 02.06.24 (OU) - see procedure note - approved for Eylea   - f/u in 6 wks -- DFE/OCT/possible injection(s)  2,3. Hypertensive retinopathy OU - discussed importance of tight BP control - continue to monitor  4. Pseudophakia OU  - s/p CE/IOL OU (Dr. Elmer Picker)  - IOLs in good position, doing well  - continue to monitor  Ophthalmic Meds Ordered this visit:  Meds ordered this encounter  Medications   aflibercept (EYLEA) SOLN 2 mg     Return in about 6 weeks (around 02/06/2024) for f/u exu ARMD OU, DFE, OCT.  There are no Patient Instructions on file for this visit.  Explained the diagnoses, plan, and follow up with the patient and they expressed understanding.  Patient expressed understanding of the importance of proper follow up care.   This document serves as a record of services personally performed by Karie Chimera, MD, PhD. It was created on their behalf by Laurey Morale, COT an ophthalmic technician. The creation of this record is the provider's dictation and/or activities during the visit.    Electronically signed by:  Charlette Caffey, COT  12/29/23 4:45 PM  This document serves as a record of services personally performed by Karie Chimera, MD, PhD. It was created on their behalf by Glee Arvin. Manson Passey, OA an ophthalmic technician. The creation of this record is the provider's dictation  and/or activities during the visit.    Electronically signed by: Glee Arvin. Manson Passey, OA 12/29/23 4:45 PM   Karie Chimera, M.D., Ph.D. Diseases & Surgery of the Retina  and Vitreous [facility]  I have reviewed the above documentation for accuracy and completeness, and I agree with the above. Karie Chimera, M.D., Ph.D. 12/29/23 4:48 PM   Abbreviations: M myopia (nearsighted); A astigmatism; H hyperopia (farsighted); P presbyopia; Mrx spectacle prescription;  CTL contact lenses; OD right eye; OS left eye; OU both eyes  XT exotropia; ET esotropia; PEK punctate epithelial keratitis; PEE punctate epithelial erosions; DES dry eye syndrome; MGD meibomian gland dysfunction; ATs artificial tears; PFAT's preservative free artificial tears; NSC nuclear sclerotic cataract; PSC posterior subcapsular cataract; ERM epi-retinal membrane; PVD posterior vitreous detachment; RD retinal detachment; DM diabetes mellitus; DR diabetic retinopathy; NPDR non-proliferative diabetic retinopathy; PDR proliferative diabetic retinopathy; CSME clinically significant macular edema; DME diabetic macular edema; dbh dot blot hemorrhages; CWS cotton wool spot; POAG primary open angle glaucoma; C/D cup-to-disc ratio; HVF humphrey visual field; GVF goldmann visual field; OCT optical coherence tomography; IOP intraocular pressure; BRVO Branch retinal vein occlusion; CRVO central retinal vein occlusion; CRAO central retinal artery occlusion; BRAO branch retinal artery occlusion; RT retinal tear; SB scleral buckle; PPV pars plana vitrectomy; VH Vitreous hemorrhage; PRP panretinal laser photocoagulation; IVK intravitreal kenalog; VMT vitreomacular traction; MH Macular hole;  NVD neovascularization of the disc; NVE neovascularization elsewhere; AREDS age related eye disease study; ARMD age related macular degeneration; POAG primary open angle glaucoma; EBMD epithelial/anterior basement membrane dystrophy; ACIOL anterior  chamber intraocular lens; IOL intraocular lens; PCIOL posterior chamber intraocular lens; Phaco/IOL phacoemulsification with intraocular lens placement; PRK photorefractive keratectomy; LASIK laser assisted in situ keratomileusis; HTN hypertension; DM diabetes mellitus; COPD chronic obstructive pulmonary disease

## 2023-12-26 ENCOUNTER — Ambulatory Visit (INDEPENDENT_AMBULATORY_CARE_PROVIDER_SITE_OTHER): Payer: Medicare Other | Admitting: Ophthalmology

## 2023-12-26 ENCOUNTER — Encounter (INDEPENDENT_AMBULATORY_CARE_PROVIDER_SITE_OTHER): Payer: Self-pay | Admitting: Ophthalmology

## 2023-12-26 DIAGNOSIS — H353231 Exudative age-related macular degeneration, bilateral, with active choroidal neovascularization: Secondary | ICD-10-CM | POA: Diagnosis not present

## 2023-12-26 DIAGNOSIS — Z961 Presence of intraocular lens: Secondary | ICD-10-CM | POA: Diagnosis not present

## 2023-12-26 DIAGNOSIS — H35033 Hypertensive retinopathy, bilateral: Secondary | ICD-10-CM

## 2023-12-26 DIAGNOSIS — I10 Essential (primary) hypertension: Secondary | ICD-10-CM

## 2023-12-26 MED ORDER — AFLIBERCEPT 2MG/0.05ML IZ SOLN FOR KALEIDOSCOPE
2.0000 mg | INTRAVITREAL | Status: AC | PRN
  Administered 2023-12-26: 2 mg via INTRAVITREAL

## 2024-01-17 NOTE — Progress Notes (Signed)
 [facility] - Clinic Note  01/30/2024    CHIEF COMPLAINT Patient presents for Retina Follow Up   HISTORY OF PRESENT ILLNESS: Audrey Sosa is a 89 y.o. female who presents to the clinic today for:   HPI     Retina Follow Up   Patient presents with  Wet AMD.  In both eyes.  This started 6 weeks ago.  Duration of 6 weeks.  Since onset it is stable.  I, the attending physician,  performed the HPI with the patient and updated documentation appropriately.        Comments   6 week retina follow up AMD OU and IVA vs IVE OU pt is reporting no vision changes noticed she denies any flashes or floaters       Last edited by Rennis Chris, MD on 01/30/2024 11:54 AM.      Referring physician: Georgann Housekeeper, MD [address] [place],  [place] [address]  HISTORICAL INFORMATION:   Selected notes from the MEDICAL RECORD NUMBER Referred by Dr. Zenaida Niece for ex ARMD LEE:  Ocular Hx- PMH-    CURRENT MEDICATIONS: No current outpatient medications on file. (Ophthalmic Drugs)   No current facility-administered medications for this visit. (Ophthalmic Drugs)   Current Outpatient Medications (Other)  Medication Sig   Cholecalciferol (VITAMIN D3) 1.25 MG (50000 UT) CAPS Take by mouth as directed.   Multiple Vitamins-Minerals (MULTIVITAMIN ADULT EXTRA C PO)    raloxifene (EVISTA) 60 MG tablet Take 60 mg by mouth daily.   simvastatin (ZOCOR) 20 MG tablet Take 20 mg by mouth daily.   valsartan-hydrochlorothiazide (DIOVAN-HCT) 160-12.5 MG tablet Take by mouth as directed.   No current facility-administered medications for this visit. (Other)   REVIEW OF SYSTEMS: ROS   Positive for: Eyes Negative for: Constitutional, Gastrointestinal, Neurological, Skin, Genitourinary, Musculoskeletal, HENT, Endocrine, Cardiovascular, Respiratory, Psychiatric, Allergic/Imm, Heme/Lymph Last edited by Etheleen Mayhew, COT on 01/30/2024  9:30 AM.          ALLERGIES Not  on File  PAST MEDICAL HISTORY History reviewed. No pertinent past medical history. Past Surgical History:  Procedure Laterality Date   CATARACT EXTRACTION     FAMILY HISTORY Family History  Problem Relation Age of Onset   Macular degeneration Mother    Macular degeneration Father    SOCIAL HISTORY Social History   Tobacco Use   Smoking status: Never   Smokeless tobacco: Never  Vaping Use   Vaping status: Never Used       OPHTHALMIC EXAM:  Base Eye Exam     Visual Acuity (Snellen - Linear)       Right Left   Dist [place] 20/20 20/25 -2   Dist ph [place]  20/20 -2         Tonometry (Tonopen, 9:35 AM)       Right Left   Pressure 14 14         Pupils       Pupils Dark Light Shape React APD   Right PERRL 3 2 Round Brisk None   Left PERRL 3 2 Round Brisk None         Visual Fields       Left Right    Full Full         Extraocular Movement       Right Left    Full, Ortho Full, Ortho         Neuro/Psych     Oriented x3: Yes  Mood/Affect: Normal         Dilation     Both eyes: 2.5% Phenylephrine @ 9:35 AM           Slit Lamp and Fundus Exam     Slit Lamp Exam       Right Left   Lids/Lashes Dermatochalasis - upper lid Dermatochalasis - upper lid   Conjunctiva/Sclera White and quiet White and quiet   Cornea arcus, trace PEE, mild tear film debris arcus, trace PEE, mild tear film debris   Anterior Chamber Deep and quiet Deep and quiet   Iris Round and dilated Round and dilated   Lens PC IOL in good position, trace Posterior capsular opacification PC IOL in good position, 1+ Posterior capsular opacification   Anterior Vitreous Vitreous syneresis Vitreous syneresis         Fundus Exam       Right Left   Disc Pink and Sharp, Compact, temporal PPA, peripapillary CNV with edema superior disc -- stably improved, no heme mild Pallor, Sharp rim, PPA   C/D Ratio 0.5 0.6   Macula Flat, Blunted foveal reflex, fine Drusen, RPE mottling and  clumping, No heme or edema Blunted foveal reflex, central CNV with edema/cystic changes -- slightly improved, Drusen, RPE mottling, no heme   Vessels attenuated, Tortuous attenuated, Tortuous   Periphery Attached, mild reticular degeneration, No heme Attached, mild reticular degeneration, No heme, focal peripheral drusen temporal periphery           IMAGING AND PROCEDURES  Imaging and Procedures for 01/30/2024  OCT, Retina - OU - Both Eyes       Right Eye Quality was poor. Central Foveal Thickness: 291. Progression has been stable. Findings include normal foveal contour, no IRF, no SRF, retinal drusen , subretinal hyper-reflective material, epiretinal membrane, pigment epithelial detachment (stable improvement in peripapillary IRF overlying stable low PED superior disc, Mild ERM).   Left Eye Quality was good. Central Foveal Thickness: 263. Progression has improved. Findings include normal foveal contour, no SRF, subretinal hyper-reflective material, intraretinal hyper-reflective material, pigment epithelial detachment, subretinal fluid (stable resolution of central SRF overlying stable central PED, interval improvement in IRF/cystic changes).   Notes *Images captured and stored on drive  Diagnosis / Impression:  Exu ARMD OU OD: stable improvement in peripapillary IRF overlying stable low PED superior disc, Mild ERM OS: stable resolution of central SRF overlying stable central PED, interval improvement in IRF/cystic changes  Clinical management:  See below  Abbreviations: NFP - Normal foveal profile. CME - cystoid macular edema. PED - pigment epithelial detachment. IRF - intraretinal fluid. SRF - subretinal fluid. EZ - ellipsoid zone. ERM - epiretinal membrane. ORA - outer retinal atrophy. ORT - outer retinal tubulation. SRHM - subretinal hyper-reflective material. IRHM - intraretinal hyper-reflective material      Intravitreal Injection, Pharmacologic Agent - OS - Left Eye        Time Out 01/30/2024. 11:09 AM. Confirmed correct patient, procedure, site, and patient consented.   Anesthesia Topical anesthesia was used. Anesthetic medications included Lidocaine 2%, Proparacaine 0.5%.   Procedure Preparation included 5% betadine to ocular surface, eyelid speculum. A (32g) needle was used.   Injection: 2 mg aflibercept 2 MG/0.05ML   Route: Intravitreal, Site: Left Eye   NDC: L6038910, Lot: [id number], Expiration date: 01/04/2025, Waste: 0 mL   Post-op Post injection exam found visual acuity of at least counting fingers. The patient tolerated the procedure well. There were no complications. The patient received written and verbal post  procedure care education. Post injection medications were not given.             ASSESSMENT/PLAN:    ICD-10-CM   1. Exudative age-related macular degeneration of both eyes with active choroidal neovascularization (HCC)  H35.3231 OCT, Retina - OU - Both Eyes    Intravitreal Injection, Pharmacologic Agent - OS - Left Eye    aflibercept (EYLEA) SOLN 2 mg    2. Essential hypertension  I10     3. Hypertensive retinopathy of both eyes  H35.033     4. Pseudophakia, both eyes  Z96.1       Exudative age related macular degeneration, OU  - pt delayed to follow up from 4 weeks to 8 on 11.14.23  - pt delayed to follow up from 6 weeks to 8 on 12.10.24  - OD: small peripapillary CNV -- stably improved  - OS: central CNV - FA (09.13.23) shows OD: Focal peripapillary CNV with leakage superior disc; OS: Central CNV with central blockage from heme, +leakage - s/p IVA OD #1 (09.13.23), #2 (11.14.23), #3 (12.12.23), #4 (01.09.24), #5 (02.06.24) - s/p IVA OS #1 (08.16.23), #2 (09.13.23), #3 (11.14.23), #4 (12.12.23), #5 (01.09.24) -- IVA resistance - s/p IVE OS #1 (02.06.24), #2 (03.12.24), #3 (04.16.24), #4 (05.28.24), #5 (07.16.24), # (08.29.24), #7 (10.15.24), #8 (12.10.24), #9 (sample 01.21.25)  **h/o increased edema at 7 wks noted  on 07.16.24 exam**  - BCVA OD 20/20 -- stable; OS 20/20 from 20/25 - OCT shows OD: stable improvement in peripapillary IRF overlying stable low PED superior disc at 1+ yr since last injxn (IVA OD #5); OS: stable resolution of central SRF overlying stable central PED, interval improvement in IRF/cystic changes at 6 weeks  - recommend IVE OS #10 today, 02.25.25 w/ f/u at 6 wks -- [employer] funding unavailable - will hold off on OD again today - pt wishes to proceed with injection OS - pt covering 20% coinsurance - RBA of procedure discussed, questions answered - IVE informed consent obtained and signed, 02.06.24 (OU) - see procedure note - approved for Eylea - pt covering 20% coinsurance  - f/u in 6 wks -- DFE/OCT/possible injection(s)  2,3. Hypertensive retinopathy OU - discussed importance of tight BP control - continue to monitor  4. Pseudophakia OU  - s/p CE/IOL OU (Dr. Elmer Picker)  - IOLs in good position, doing well  - continue to monitor  Ophthalmic Meds Ordered this visit:  Meds ordered this encounter  Medications   aflibercept (EYLEA) SOLN 2 mg     Return in about 6 weeks (around 03/12/2024) for f/u exu ARMD OU, DFE, OCT.  There are no Patient Instructions on file for this visit.  Explained the diagnoses, plan, and follow up with the patient and they expressed understanding.  Patient expressed understanding of the importance of proper follow up care.   This document serves as a record of services personally performed by Karie Chimera, MD, PhD. It was created on their behalf by Laurey Morale, COT an ophthalmic technician. The creation of this record is the provider's dictation and/or activities during the visit.    Electronically signed by:  Charlette Caffey, COT  01/30/24 12:47 PM  This document serves as a record of services personally performed by Karie Chimera, MD, PhD. It was created on their behalf by Glee Arvin. Manson Passey, OA an ophthalmic technician. The creation of  this record is the provider's dictation and/or activities during the visit.    Electronically signed by: Marchelle Folks  Thomes Lolling, OA 01/30/24 12:47 PM   Karie Chimera, M.D., Ph.D. Diseases & Surgery of the Retina and Vitreous [facility]  I have reviewed the above documentation for accuracy and completeness, and I agree with the above. Karie Chimera, M.D., Ph.D. 01/30/24 12:49 PM   Abbreviations: M myopia (nearsighted); A astigmatism; H hyperopia (farsighted); P presbyopia; Mrx spectacle prescription;  CTL contact lenses; OD right eye; OS left eye; OU both eyes  XT exotropia; ET esotropia; PEK punctate epithelial keratitis; PEE punctate epithelial erosions; DES dry eye syndrome; MGD meibomian gland dysfunction; ATs artificial tears; PFAT's preservative free artificial tears; NSC nuclear sclerotic cataract; PSC posterior subcapsular cataract; ERM epi-retinal membrane; PVD posterior vitreous detachment; RD retinal detachment; DM diabetes mellitus; DR diabetic retinopathy; NPDR non-proliferative diabetic retinopathy; PDR proliferative diabetic retinopathy; CSME clinically significant macular edema; DME diabetic macular edema; dbh dot blot hemorrhages; CWS cotton wool spot; POAG primary open angle glaucoma; C/D cup-to-disc ratio; HVF humphrey visual field; GVF goldmann visual field; OCT optical coherence tomography; IOP intraocular pressure; BRVO Branch retinal vein occlusion; CRVO central retinal vein occlusion; CRAO central retinal artery occlusion; BRAO branch retinal artery occlusion; RT retinal tear; SB scleral buckle; PPV pars plana vitrectomy; VH Vitreous hemorrhage; PRP panretinal laser photocoagulation; IVK intravitreal kenalog; VMT vitreomacular traction; MH Macular hole;  NVD neovascularization of the disc; NVE neovascularization elsewhere; AREDS age related eye disease study; ARMD age related macular degeneration; POAG primary open angle glaucoma; EBMD epithelial/anterior  basement membrane dystrophy; ACIOL anterior chamber intraocular lens; IOL intraocular lens; PCIOL posterior chamber intraocular lens; Phaco/IOL phacoemulsification with intraocular lens placement; PRK photorefractive keratectomy; LASIK laser assisted in situ keratomileusis; HTN hypertension; DM diabetes mellitus; COPD chronic obstructive pulmonary disease

## 2024-01-18 DIAGNOSIS — E559 Vitamin D deficiency, unspecified: Secondary | ICD-10-CM | POA: Diagnosis not present

## 2024-01-18 DIAGNOSIS — E782 Mixed hyperlipidemia: Secondary | ICD-10-CM | POA: Diagnosis not present

## 2024-01-18 DIAGNOSIS — I10 Essential (primary) hypertension: Secondary | ICD-10-CM | POA: Diagnosis not present

## 2024-01-18 DIAGNOSIS — R54 Age-related physical debility: Secondary | ICD-10-CM | POA: Diagnosis not present

## 2024-01-18 DIAGNOSIS — H3530 Unspecified macular degeneration: Secondary | ICD-10-CM | POA: Diagnosis not present

## 2024-01-18 DIAGNOSIS — M810 Age-related osteoporosis without current pathological fracture: Secondary | ICD-10-CM | POA: Diagnosis not present

## 2024-01-18 DIAGNOSIS — E7800 Pure hypercholesterolemia, unspecified: Secondary | ICD-10-CM | POA: Diagnosis not present

## 2024-01-30 ENCOUNTER — Encounter (INDEPENDENT_AMBULATORY_CARE_PROVIDER_SITE_OTHER): Payer: Self-pay | Admitting: Ophthalmology

## 2024-01-30 ENCOUNTER — Ambulatory Visit (INDEPENDENT_AMBULATORY_CARE_PROVIDER_SITE_OTHER): Payer: Medicare Other | Admitting: Ophthalmology

## 2024-01-30 DIAGNOSIS — H353231 Exudative age-related macular degeneration, bilateral, with active choroidal neovascularization: Secondary | ICD-10-CM | POA: Diagnosis not present

## 2024-01-30 DIAGNOSIS — H35033 Hypertensive retinopathy, bilateral: Secondary | ICD-10-CM | POA: Diagnosis not present

## 2024-01-30 DIAGNOSIS — I10 Essential (primary) hypertension: Secondary | ICD-10-CM | POA: Diagnosis not present

## 2024-01-30 DIAGNOSIS — Z961 Presence of intraocular lens: Secondary | ICD-10-CM

## 2024-01-30 MED ORDER — AFLIBERCEPT 2MG/0.05ML IZ SOLN FOR KALEIDOSCOPE
2.0000 mg | INTRAVITREAL | Status: AC | PRN
  Administered 2024-01-30: 2 mg via INTRAVITREAL

## 2024-03-12 ENCOUNTER — Encounter (INDEPENDENT_AMBULATORY_CARE_PROVIDER_SITE_OTHER): Payer: Medicare Other | Admitting: Ophthalmology

## 2024-03-12 DIAGNOSIS — I10 Essential (primary) hypertension: Secondary | ICD-10-CM

## 2024-03-12 DIAGNOSIS — H35033 Hypertensive retinopathy, bilateral: Secondary | ICD-10-CM

## 2024-03-12 DIAGNOSIS — Z961 Presence of intraocular lens: Secondary | ICD-10-CM

## 2024-03-12 DIAGNOSIS — H353231 Exudative age-related macular degeneration, bilateral, with active choroidal neovascularization: Secondary | ICD-10-CM

## 2024-05-10 NOTE — Progress Notes (Incomplete)
 [facility] - Clinic Note  05/23/2024    CHIEF COMPLAINT Patient presents for No chief complaint on file.   HISTORY OF PRESENT ILLNESS: Audrey Sosa is a 89 y.o. female who presents to the clinic today for:      Referring physician: Jearldine Mina, MD [address] [place],  [place] [address]  HISTORICAL INFORMATION:   Selected notes from the MEDICAL RECORD NUMBER Referred by Dr. Carloyn Chi for ex ARMD LEE:  Ocular Hx- PMH-    CURRENT MEDICATIONS: No current outpatient medications on file. (Ophthalmic Drugs)   No current facility-administered medications for this visit. (Ophthalmic Drugs)   Current Outpatient Medications (Other)  Medication Sig   Cholecalciferol (VITAMIN D3) 1.25 MG (50000 UT) CAPS Take by mouth as directed.   Multiple Vitamins-Minerals (MULTIVITAMIN ADULT EXTRA C PO)    raloxifene (EVISTA) 60 MG tablet Take 60 mg by mouth daily.   simvastatin (ZOCOR) 20 MG tablet Take 20 mg by mouth daily.   valsartan-hydrochlorothiazide (DIOVAN-HCT) 160-12.5 MG tablet Take by mouth as directed.   No current facility-administered medications for this visit. (Other)   REVIEW OF SYSTEMS:        ALLERGIES Not on File  PAST MEDICAL HISTORY No past medical history on file. Past Surgical History:  Procedure Laterality Date   CATARACT EXTRACTION     FAMILY HISTORY Family History  Problem Relation Age of Onset   Macular degeneration Mother    Macular degeneration Father    SOCIAL HISTORY Social History   Tobacco Use   Smoking status: Never   Smokeless tobacco: Never  Vaping Use   Vaping status: Never Used       OPHTHALMIC EXAM:  Not recorded    IMAGING AND PROCEDURES  Imaging and Procedures for 05/23/2024           ASSESSMENT/PLAN:    ICD-10-CM   1. Exudative age-related macular degeneration of both eyes with active choroidal neovascularization (HCC)  H35.3231     2. Essential hypertension  I10     3.  Hypertensive retinopathy of both eyes  H35.033     4. Pseudophakia, both eyes  Z96.1       Exudative age related macular degeneration, OU  - pt delayed to follow up from 4 weeks to 8 on 11.14.23  - pt delayed to follow up from 6 weeks to 8 on 12.10.24  - OD: small peripapillary CNV -- stably improved  - OS: central CNV - FA (09.13.23) shows OD: Focal peripapillary CNV with leakage superior disc; OS: Central CNV with central blockage from heme, +leakage - s/p IVA OD #1 (09.13.23), #2 (11.14.23), #3 (12.12.23), #4 (01.09.24), #5 (02.06.24) - s/p IVA OS #1 (08.16.23), #2 (09.13.23), #3 (11.14.23), #4 (12.12.23), #5 (01.09.24) -- IVA resistance - s/p IVE OS #1 (02.06.24), #2 (03.12.24), #3 (04.16.24), #4 (05.28.24), #5 (07.16.24), # (08.29.24), #7 (10.15.24), #8 (12.10.24), #9 (sample 01.21.25), #10 (02.25.25)  **h/o increased edema at 7 wks noted on 07.16.24 exam**  - BCVA OD 20/20 -- stable; OS 20/20 from 20/25 - OCT shows OD: stable improvement in peripapillary IRF overlying stable low PED superior disc at 1+ yr since last injxn (IVA OD #5); OS: stable resolution of central SRF overlying stable central PED, interval improvement in IRF/cystic changes at 6 weeks  - recommend IVE OS #11 today, 06.19.25 w/ f/u at 6 wks -- [employer] funding unavailable - will hold off on OD again today - pt wishes to proceed  with injection OS - pt covering 20% coinsurance - RBA of procedure discussed, questions answered - IVE informed consent obtained and signed, 02.06.24 (OU) - see procedure note - approved for Eylea  - pt covering 20% coinsurance  - f/u in 6 wks -- DFE/OCT/possible injection(s)  2,3. Hypertensive retinopathy OU - discussed importance of tight BP control - continue to monitor  4. Pseudophakia OU  - s/p CE/IOL OU (Dr. Lasandra Points)  - IOLs in good position, doing well  - continue to monitor  Ophthalmic Meds Ordered this visit:  No orders of the defined types were placed in this encounter.     No follow-ups on file.  There are no Patient Instructions on file for this visit.  Explained the diagnoses, plan, and follow up with the patient and they expressed understanding.  Patient expressed understanding of the importance of proper follow up care.   This document serves as a record of services personally performed by Jeanice Millard, MD, PhD. It was created on their behalf by Morley Arabia. Bevin Bucks, OA an ophthalmic technician. The creation of this record is the provider's dictation and/or activities during the visit.    Electronically signed by: Morley Arabia. Bevin Bucks, OA 05/10/24 12:20 PM    Jeanice Millard, M.D., Ph.D. Diseases & Surgery of the Retina and Vitreous [facility]    Abbreviations: M myopia (nearsighted); A astigmatism; H hyperopia (farsighted); P presbyopia; Mrx spectacle prescription;  CTL contact lenses; OD right eye; OS left eye; OU both eyes  XT exotropia; ET esotropia; PEK punctate epithelial keratitis; PEE punctate epithelial erosions; DES dry eye syndrome; MGD meibomian gland dysfunction; ATs artificial tears; PFAT's preservative free artificial tears; NSC nuclear sclerotic cataract; PSC posterior subcapsular cataract; ERM epi-retinal membrane; PVD posterior vitreous detachment; RD retinal detachment; DM diabetes mellitus; DR diabetic retinopathy; NPDR non-proliferative diabetic retinopathy; PDR proliferative diabetic retinopathy; CSME clinically significant macular edema; DME diabetic macular edema; dbh dot blot hemorrhages; CWS cotton wool spot; POAG primary open angle glaucoma; C/D cup-to-disc ratio; HVF humphrey visual field; GVF goldmann visual field; OCT optical coherence tomography; IOP intraocular pressure; BRVO Branch retinal vein occlusion; CRVO central retinal vein occlusion; CRAO central retinal artery occlusion; BRAO branch retinal artery occlusion; RT retinal tear; SB scleral buckle; PPV pars plana vitrectomy; VH Vitreous hemorrhage; PRP  panretinal laser photocoagulation; IVK intravitreal kenalog; VMT vitreomacular traction; MH Macular hole;  NVD neovascularization of the disc; NVE neovascularization elsewhere; AREDS age related eye disease study; ARMD age related macular degeneration; POAG primary open angle glaucoma; EBMD epithelial/anterior basement membrane dystrophy; ACIOL anterior chamber intraocular lens; IOL intraocular lens; PCIOL posterior chamber intraocular lens; Phaco/IOL phacoemulsification with intraocular lens placement; PRK photorefractive keratectomy; LASIK laser assisted in situ keratomileusis; HTN hypertension; DM diabetes mellitus; COPD chronic obstructive pulmonary disease

## 2024-05-23 ENCOUNTER — Encounter (INDEPENDENT_AMBULATORY_CARE_PROVIDER_SITE_OTHER): Admitting: Ophthalmology

## 2024-05-23 DIAGNOSIS — Z961 Presence of intraocular lens: Secondary | ICD-10-CM

## 2024-05-23 DIAGNOSIS — H35033 Hypertensive retinopathy, bilateral: Secondary | ICD-10-CM

## 2024-05-23 DIAGNOSIS — I10 Essential (primary) hypertension: Secondary | ICD-10-CM

## 2024-05-23 DIAGNOSIS — H353231 Exudative age-related macular degeneration, bilateral, with active choroidal neovascularization: Secondary | ICD-10-CM

## 2024-05-23 NOTE — Progress Notes (Signed)
 [facility] - Clinic Note  06/03/2024    CHIEF COMPLAINT Patient presents for Retina Follow Up   HISTORY OF PRESENT ILLNESS: Audrey Sosa is a 89 y.o. female who presents to the clinic today for:   HPI     Retina Follow Up   Patient presents with  Wet AMD.  In both eyes.  This started 2 years ago.  Duration of 6 weeks.  Since onset it is gradually worsening.  I, the attending physician,  performed the HPI with the patient and updated documentation appropriately.        Comments   Pt denies changes in vision/FOL/floaters/pain. Pt uses ats PRN.      Last edited by Valdemar Rogue, MD on 06/03/2024  8:38 AM.     Referring physician: Ransom Other, MD [address] [place],  [place] [address]  HISTORICAL INFORMATION:   Selected notes from the MEDICAL RECORD NUMBER Referred by Dr. Fleeta for ex ARMD LEE:  Ocular Hx- PMH-    CURRENT MEDICATIONS: No current outpatient medications on file. (Ophthalmic Drugs)   No current facility-administered medications for this visit. (Ophthalmic Drugs)   Current Outpatient Medications (Other)  Medication Sig   Cholecalciferol (VITAMIN D3) 1.25 MG (50000 UT) CAPS Take by mouth as directed.   Multiple Vitamins-Minerals (MULTIVITAMIN ADULT EXTRA C PO)    raloxifene (EVISTA) 60 MG tablet Take 60 mg by mouth daily.   simvastatin (ZOCOR) 20 MG tablet Take 20 mg by mouth daily.   valsartan-hydrochlorothiazide (DIOVAN-HCT) 160-12.5 MG tablet Take by mouth as directed.   No current facility-administered medications for this visit. (Other)   REVIEW OF SYSTEMS: ROS   Positive for: Eyes Negative for: Constitutional, Gastrointestinal, Neurological, Skin, Genitourinary, Musculoskeletal, HENT, Endocrine, Cardiovascular, Respiratory, Psychiatric, Allergic/Imm, Heme/Lymph Last edited by Elnor Avelina GORMAN, COT on 06/03/2024  8:12 AM.     ALLERGIES Not on File  PAST MEDICAL HISTORY History reviewed. No pertinent past  medical history. Past Surgical History:  Procedure Laterality Date   CATARACT EXTRACTION     FAMILY HISTORY Family History  Problem Relation Age of Onset   Macular degeneration Mother    Macular degeneration Father    SOCIAL HISTORY Social History   Tobacco Use   Smoking status: Never   Smokeless tobacco: Never  Vaping Use   Vaping status: Never Used       OPHTHALMIC EXAM:  Base Eye Exam     Visual Acuity (Snellen - Linear)       Right Left   Dist [place] 20/30 +1 20/50 +2   Dist ph [place] NI NI         Tonometry (Tonopen, 8:25 AM)       Right Left   Pressure 13 10         Pupils       Pupils Dark Light Shape React APD   Right PERRL 3 2 Round Brisk None   Left PERRL 3 2 Round Brisk None         Visual Fields       Left Right    Full Full         Extraocular Movement       Right Left    Full, Ortho Full, Ortho         Neuro/Psych     Oriented x3: Yes   Mood/Affect: Normal         Dilation     Both eyes: 1.0% Mydriacyl,  2.5% Phenylephrine @ 8:26 AM           Slit Lamp and Fundus Exam     Slit Lamp Exam       Right Left   Lids/Lashes Dermatochalasis - upper lid Dermatochalasis - upper lid   Conjunctiva/Sclera White and quiet White and quiet   Cornea arcus, trace PEE, mild tear film debris arcus, trace PEE, mild tear film debris   Anterior Chamber Deep and quiet Deep and quiet   Iris Round and dilated Round and dilated   Lens PC IOL in good position, trace Posterior capsular opacification PC IOL in good position, 1+ Posterior capsular opacification   Anterior Vitreous Vitreous syneresis Vitreous syneresis         Fundus Exam       Right Left   Disc Pink and Sharp, Compact, temporal PPA, peripapillary CNV with edema superior disc -- stably improved, no heme mild Pallor, Sharp rim, PPA   C/D Ratio 0.5 0.6   Macula Flat, Blunted foveal reflex, fine Drusen, RPE mottling and clumping, central CNV with interval increase in SRF /  edema Blunted foveal reflex, central CNV with new SRH/edema/cystic changes, Drusen, RPE mottling   Vessels attenuated, Tortuous attenuated, Tortuous   Periphery Attached, mild reticular degeneration, No heme Attached, mild reticular degeneration, No heme, focal peripheral drusen temporal periphery           Refraction     Manifest Refraction (Subjective)       Sphere Cylinder Axis Dist VA   Right -0.50 +0.25 022 25-2   Left -1.00 +1.00 178 40+2           IMAGING AND PROCEDURES  Imaging and Procedures for 06/03/2024  OCT, Retina - OU - Both Eyes       Right Eye Quality was poor. Central Foveal Thickness: 359. Progression has worsened. Findings include no IRF, abnormal foveal contour, retinal drusen , subretinal hyper-reflective material, choroidal neovascular membrane, epiretinal membrane, pigment epithelial detachment, subretinal fluid (Interval re-development of central PED / CNV with SRF / SRHM overlying, Mild ERM).   Left Eye Quality was good. Central Foveal Thickness: 338. Progression has worsened. Findings include abnormal foveal contour, subretinal hyper-reflective material, intraretinal hyper-reflective material, intraretinal fluid, pigment epithelial detachment, subretinal fluid (Interval increase in central CNV / PED with increased SRF, SRHM and IRF overlying).   Notes *Images captured and stored on drive  Diagnosis / Impression:  Exu ARMD OU OD: central PED / CNV with interval re-development of SRF / SRHM overlying, Mild ERM OS: Interval increase in central CNV / PED with increased SRF, SRHM and IRF overlying  Clinical management:  See below  Abbreviations: NFP - Normal foveal profile. CME - cystoid macular edema. PED - pigment epithelial detachment. IRF - intraretinal fluid. SRF - subretinal fluid. EZ - ellipsoid zone. ERM - epiretinal membrane. ORA - outer retinal atrophy. ORT - outer retinal tubulation. SRHM - subretinal hyper-reflective material. IRHM -  intraretinal hyper-reflective material      Intravitreal Injection, Pharmacologic Agent - OD - Right Eye       Time Out 06/03/2024. 8:47 AM. Confirmed correct patient, procedure, site, and patient consented.   Anesthesia Topical anesthesia was used. Anesthetic medications included Lidocaine 2%, Proparacaine 0.5%.   Procedure Preparation included 5% betadine to ocular surface, eyelid speculum. A (32g) needle was used.   Injection: 2 mg aflibercept  2 MG/0.05ML   Route: Intravitreal, Site: Right Eye   NDC: Q956576, Lot: [id number], Expiration date: 10/04/2025, Waste: 0 mL  Post-op Post injection exam found visual acuity of at least counting fingers. The patient tolerated the procedure well. There were no complications. The patient received written and verbal post procedure care education. Post injection medications were not given.      Intravitreal Injection, Pharmacologic Agent - OS - Left Eye       Time Out 06/03/2024. 8:47 AM. Confirmed correct patient, procedure, site, and patient consented.   Anesthesia Topical anesthesia was used. Anesthetic medications included Lidocaine 2%, Proparacaine 0.5%.   Procedure Preparation included 5% betadine to ocular surface, eyelid speculum. A (32g) needle was used.   Injection: 6 mg faricimab-svoa 6 MG/0.05ML (Patient supplied)   Route: Intravitreal, Site: Left Eye   NDC: 49757-903-98, Lot: [id number], Expiration date: 06/03/2026, Waste: 0 mL   Post-op Post injection exam found visual acuity of at least counting fingers. The patient tolerated the procedure well. There were no complications. The patient received written and verbal post procedure care education. Post injection medications were not given.   Notes **SAMPLE MEDICATION ADMINISTERED**           ASSESSMENT/PLAN:    ICD-10-CM   1. Exudative age-related macular degeneration of both eyes with active choroidal neovascularization (HCC)  H35.3231 OCT, Retina - OU -  Both Eyes    Intravitreal Injection, Pharmacologic Agent - OD - Right Eye    Intravitreal Injection, Pharmacologic Agent - OS - Left Eye    faricimab-svoa (VABYSMO) 6mg /0.13mL intravitreal injection    aflibercept  (EYLEA ) SOLN 2 mg    2. Essential hypertension  I10     3. Hypertensive retinopathy of both eyes  H35.033     4. Pseudophakia, both eyes  Z96.1      Exudative age related macular degeneration, OU  - pt delayed to follow up from 6 weeks to 4 months on 06.30.25  - pt delayed to follow up from 4 weeks to 8 on 11.14.23  - pt delayed to follow up from 6 weeks to 8 on 12.10.24 - FA (09.13.23) shows OD: Focal peripapillary CNV with leakage superior disc; OS: Central CNV with central blockage from heme, +leakage - s/p IVA OD #1 (09.13.23), #2 (11.14.23), #3 (12.12.23), #4 (01.09.24), #5 (02.06.24) - s/p IVA OS #1 (08.16.23), #2 (09.13.23), #3 (11.14.23), #4 (12.12.23), #5 (01.09.24) -- IVA resistance - s/p IVE OS #1 (02.06.24), #2 (03.12.24), #3 (04.16.24), #4 (05.28.24), #5 (07.16.24), # (08.29.24), #7 (10.15.24), #8 (12.10.24), #9 (sample 01.21.25), #10 (02.25.25)  **h/o increased edema at 7 wks noted on 07.16.24 exam**  - BCVA OD decreased to 20/30 from 20/20; OS decreased to 20/50 from 20/20 - OCT shows OD: central PED / CNV with interval re-development of SRF / SRHM overlying, Mild ERM; OS: Interval increase in central CNV / PED with increased SRF, SRHM and IRF overlying at 4 months since last IVE OS  - recommend IVE OD #1 and IVV OS #1 (SAMPLE) today, 06.30.25 w/ f/u in 4-6 wks -- [employer] funding unavailable - pt wishes to proceed with injection OU - pt covering 20% coinsurance on Eylea  - RBA of procedure discussed, questions answered - IVE informed consent obtained and signed, 02.06.24 (OU) - see procedure note - approved for Eylea  - pt covering 20% coinsurance  - f/u in 4-6 wks -- DFE/OCT/possible injection(s)  2,3. Hypertensive retinopathy OU - discussed importance of  tight BP control - continue to monitor  4. Pseudophakia OU  - s/p CE/IOL OU (Dr. Cleatus)  - IOLs in good position, doing well  - continue to  monitor  Ophthalmic Meds Ordered this visit:  Meds ordered this encounter  Medications   faricimab-svoa (VABYSMO) 6mg /0.46mL intravitreal injection   aflibercept  (EYLEA ) SOLN 2 mg     Return for f/u 4-6 weeks, exu ARMD OU, DFE, OCT, Possible Injxn.  There are no Patient Instructions on file for this visit.  Explained the diagnoses, plan, and follow up with the patient and they expressed understanding.  Patient expressed understanding of the importance of proper follow up care.   This document serves as a record of services personally performed by Redell JUDITHANN Hans, MD, PhD. It was created on their behalf by Avelina Pereyra, COA an ophthalmic technician. The creation of this record is the provider's dictation and/or activities during the visit.   Electronically signed by: Avelina GORMAN Pereyra, COT  06/03/24  10:52 AM   This document serves as a record of services personally performed by Redell JUDITHANN Hans, MD, PhD. It was created on their behalf by Alan PARAS. Delores, OA an ophthalmic technician. The creation of this record is the provider's dictation and/or activities during the visit.    Electronically signed by: Alan PARAS. Delores, OA 06/03/24 10:52 AM  Redell JUDITHANN Hans, M.D., Ph.D. Diseases & Surgery of the Retina and Vitreous [facility]  I have reviewed the above documentation for accuracy and completeness, and I agree with the above. Redell JUDITHANN Hans, M.D., Ph.D. 06/03/24 10:54 AM   Abbreviations: M myopia (nearsighted); A astigmatism; H hyperopia (farsighted); P presbyopia; Mrx spectacle prescription;  CTL contact lenses; OD right eye; OS left eye; OU both eyes  XT exotropia; ET esotropia; PEK punctate epithelial keratitis; PEE punctate epithelial erosions; DES dry eye syndrome; MGD meibomian gland dysfunction; ATs artificial tears;  PFAT's preservative free artificial tears; NSC nuclear sclerotic cataract; PSC posterior subcapsular cataract; ERM epi-retinal membrane; PVD posterior vitreous detachment; RD retinal detachment; DM diabetes mellitus; DR diabetic retinopathy; NPDR non-proliferative diabetic retinopathy; PDR proliferative diabetic retinopathy; CSME clinically significant macular edema; DME diabetic macular edema; dbh dot blot hemorrhages; CWS cotton wool spot; POAG primary open angle glaucoma; C/D cup-to-disc ratio; HVF humphrey visual field; GVF goldmann visual field; OCT optical coherence tomography; IOP intraocular pressure; BRVO Branch retinal vein occlusion; CRVO central retinal vein occlusion; CRAO central retinal artery occlusion; BRAO branch retinal artery occlusion; RT retinal tear; SB scleral buckle; PPV pars plana vitrectomy; VH Vitreous hemorrhage; PRP panretinal laser photocoagulation; IVK intravitreal kenalog; VMT vitreomacular traction; MH Macular hole;  NVD neovascularization of the disc; NVE neovascularization elsewhere; AREDS age related eye disease study; ARMD age related macular degeneration; POAG primary open angle glaucoma; EBMD epithelial/anterior basement membrane dystrophy; ACIOL anterior chamber intraocular lens; IOL intraocular lens; PCIOL posterior chamber intraocular lens; Phaco/IOL phacoemulsification with intraocular lens placement; PRK photorefractive keratectomy; LASIK laser assisted in situ keratomileusis; HTN hypertension; DM diabetes mellitus; COPD chronic obstructive pulmonary disease

## 2024-05-24 ENCOUNTER — Encounter (INDEPENDENT_AMBULATORY_CARE_PROVIDER_SITE_OTHER): Admitting: Ophthalmology

## 2024-06-03 ENCOUNTER — Ambulatory Visit (INDEPENDENT_AMBULATORY_CARE_PROVIDER_SITE_OTHER): Admitting: Ophthalmology

## 2024-06-03 ENCOUNTER — Encounter (INDEPENDENT_AMBULATORY_CARE_PROVIDER_SITE_OTHER): Payer: Self-pay | Admitting: Ophthalmology

## 2024-06-03 DIAGNOSIS — I10 Essential (primary) hypertension: Secondary | ICD-10-CM | POA: Diagnosis not present

## 2024-06-03 DIAGNOSIS — H353231 Exudative age-related macular degeneration, bilateral, with active choroidal neovascularization: Secondary | ICD-10-CM | POA: Diagnosis not present

## 2024-06-03 DIAGNOSIS — M810 Age-related osteoporosis without current pathological fracture: Secondary | ICD-10-CM | POA: Diagnosis not present

## 2024-06-03 DIAGNOSIS — Z961 Presence of intraocular lens: Secondary | ICD-10-CM | POA: Diagnosis not present

## 2024-06-03 DIAGNOSIS — H35033 Hypertensive retinopathy, bilateral: Secondary | ICD-10-CM

## 2024-06-03 DIAGNOSIS — E782 Mixed hyperlipidemia: Secondary | ICD-10-CM | POA: Diagnosis not present

## 2024-06-03 MED ORDER — AFLIBERCEPT 2MG/0.05ML IZ SOLN FOR KALEIDOSCOPE
2.0000 mg | INTRAVITREAL | Status: AC | PRN
  Administered 2024-06-03: 2 mg via INTRAVITREAL

## 2024-06-03 MED ORDER — FARICIMAB-SVOA 6 MG/0.05ML IZ SOLN
6.0000 mg | INTRAVITREAL | Status: AC | PRN
  Administered 2024-06-03: 6 mg via INTRAVITREAL

## 2024-06-11 NOTE — Progress Notes (Signed)
 [facility] - Clinic Note  06/03/2024    CHIEF COMPLAINT Patient presents for Retina Follow Up   HISTORY OF PRESENT ILLNESS: Audrey Sosa is a 89 y.o. female who presents to the clinic today for:   HPI     Retina Follow Up           Diagnosis: Wet AMD   Laterality: both eyes   Onset: 2 years ago   Duration: 6 weeks   Course: gradually worsening   MD Performed: performed the HPI with the patient and updated documentation appropriately         Comments   Pt denies changes in vision/FOL/floaters/pain. Pt uses ats PRN.      Last edited by Valdemar Rogue, MD on 06/03/2024  8:38 AM.     Referring physician: Ransom Other, MD [address] [place],  [place] [address]  HISTORICAL INFORMATION:   Selected notes from the MEDICAL RECORD NUMBER Referred by Dr. Fleeta for ex ARMD LEE:  Ocular Hx- PMH-    CURRENT MEDICATIONS: No current outpatient medications on file. (Ophthalmic Drugs)   No current facility-administered medications for this visit. (Ophthalmic Drugs)   Current Outpatient Medications (Other)  Medication Sig   Cholecalciferol (VITAMIN D3) 1.25 MG (50000 UT) CAPS Take by mouth as directed.   Multiple Vitamins-Minerals (MULTIVITAMIN ADULT EXTRA C PO)    raloxifene (EVISTA) 60 MG tablet Take 60 mg by mouth daily.   simvastatin (ZOCOR) 20 MG tablet Take 20 mg by mouth daily.   valsartan-hydrochlorothiazide (DIOVAN-HCT) 160-12.5 MG tablet Take by mouth as directed.   No current facility-administered medications for this visit. (Other)   REVIEW OF SYSTEMS: ROS   Positive for: Eyes Negative for: Constitutional, Gastrointestinal, Neurological, Skin, Genitourinary, Musculoskeletal, HENT, Endocrine, Cardiovascular, Respiratory, Psychiatric, Allergic/Imm, Heme/Lymph Last edited by Elnor Avelina GORMAN, COT on 06/03/2024  8:12 AM.     ALLERGIES Not on File  PAST MEDICAL HISTORY History reviewed. No pertinent past medical  history. Past Surgical History:  Procedure Laterality Date   CATARACT EXTRACTION     FAMILY HISTORY Family History  Problem Relation Age of Onset   Macular degeneration Mother    Macular degeneration Father    SOCIAL HISTORY Social History   Tobacco Use   Smoking status: Never   Smokeless tobacco: Never  Vaping Use   Vaping status: Never Used       OPHTHALMIC EXAM:  Base Eye Exam     Visual Acuity (Snellen - Linear)       Right Left   Dist [place] 20/30 +1 20/50 +2   Dist ph  NI NI         Tonometry (Tonopen, 8:25 AM)       Right Left   Pressure 13 10         Pupils       Pupils Dark Light Shape React APD   Right PERRL 3 2 Round Brisk None   Left PERRL 3 2 Round Brisk None         Visual Fields       Left Right    Full Full         Extraocular Movement       Right Left    Full, Ortho Full, Ortho         Neuro/Psych     Oriented x3: Yes   Mood/Affect: Normal         Dilation     Both  eyes: 1.0% Mydriacyl, 2.5% Phenylephrine @ 8:26 AM           Slit Lamp and Fundus Exam     Slit Lamp Exam       Right Left   Lids/Lashes Dermatochalasis - upper lid Dermatochalasis - upper lid   Conjunctiva/Sclera White and quiet White and quiet   Cornea arcus, trace PEE, mild tear film debris arcus, trace PEE, mild tear film debris   Anterior Chamber Deep and quiet Deep and quiet   Iris Round and dilated Round and dilated   Lens PC IOL in good position, trace Posterior capsular opacification PC IOL in good position, 1+ Posterior capsular opacification   Vitreous Vitreous syneresis Vitreous syneresis         Fundus Exam       Right Left   Disc Pink and Sharp, Compact, temporal PPA, peripapillary CNV with edema superior disc -- stably improved, no heme mild Pallor, Sharp rim, PPA   C/D Ratio 0.5 0.6   Macula Flat, Blunted foveal reflex, fine Drusen, RPE mottling and clumping, central CNV with interval increase in SRF / edema Blunted  foveal reflex, central CNV with new SRH/edema/cystic changes, Drusen, RPE mottling   Vessels attenuated, Tortuous attenuated, Tortuous   Periphery Attached, mild reticular degeneration, No heme Attached, mild reticular degeneration, No heme, focal peripheral drusen temporal periphery           Refraction     Manifest Refraction (Subjective)       Sphere Cylinder Axis Dist VA   Right -0.50 +0.25 022 25-2   Left -1.00 +1.00 178 40+2           IMAGING AND PROCEDURES  Imaging and Procedures for 06/03/2024  OCT, Retina - OU - Both Eyes       Right Eye Quality was poor. Central Foveal Thickness: 359. Progression has worsened. Findings include no IRF, abnormal foveal contour, retinal drusen , subretinal hyper-reflective material, choroidal neovascular membrane, epiretinal membrane, pigment epithelial detachment, subretinal fluid (Interval re-development of central PED / CNV with SRF / SRHM overlying, Mild ERM).   Left Eye Quality was good. Central Foveal Thickness: 338. Progression has worsened. Findings include abnormal foveal contour, subretinal hyper-reflective material, intraretinal hyper-reflective material, intraretinal fluid, pigment epithelial detachment, subretinal fluid (Interval increase in central CNV / PED with increased SRF, SRHM and IRF overlying).   Notes *Images captured and stored on drive  Diagnosis / Impression:  Exu ARMD OU OD: central PED / CNV with interval re-development of SRF / SRHM overlying, Mild ERM OS: Interval increase in central CNV / PED with increased SRF, SRHM and IRF overlying  Clinical management:  See below  Abbreviations: NFP - Normal foveal profile. CME - cystoid macular edema. PED - pigment epithelial detachment. IRF - intraretinal fluid. SRF - subretinal fluid. EZ - ellipsoid zone. ERM - epiretinal membrane. ORA - outer retinal atrophy. ORT - outer retinal tubulation. SRHM - subretinal hyper-reflective material. IRHM - intraretinal  hyper-reflective material      Intravitreal Injection, Pharmacologic Agent - OD - Right Eye       Time Out 06/03/2024. 8:47 AM. Confirmed correct patient, procedure, site, and patient consented.   Anesthesia Topical anesthesia was used. Anesthetic medications included Lidocaine 2%, Proparacaine 0.5%.   Procedure Preparation included 5% betadine to ocular surface, eyelid speculum. A (32g) needle was used.   Injection: 2 mg aflibercept  2 MG/0.05ML   Route: Intravitreal, Site: Right Eye   NDC: D2246706, Lot: [id number], Expiration date: 10/04/2025, Waste:  0 mL   Post-op Post injection exam found visual acuity of at least counting fingers. The patient tolerated the procedure well. There were no complications. The patient received written and verbal post procedure care education. Post injection medications were not given.      Intravitreal Injection, Pharmacologic Agent - OS - Left Eye       Time Out 06/03/2024. 8:47 AM. Confirmed correct patient, procedure, site, and patient consented.   Anesthesia Topical anesthesia was used. Anesthetic medications included Lidocaine 2%, Proparacaine 0.5%.   Procedure Preparation included 5% betadine to ocular surface, eyelid speculum. A (32g) needle was used.   Injection: 6 mg faricimab -svoa 6 MG/0.05ML (Patient supplied)   Route: Intravitreal, Site: Left Eye   NDC: 49757-903-98, Lot: [id number], Expiration date: 06/03/2026, Waste: 0 mL   Post-op Post injection exam found visual acuity of at least counting fingers. The patient tolerated the procedure well. There were no complications. The patient received written and verbal post procedure care education. Post injection medications were not given.   Notes **SAMPLE MEDICATION ADMINISTERED**           ASSESSMENT/PLAN:    ICD-10-CM   1. Exudative age-related macular degeneration of both eyes with active choroidal neovascularization (HCC)  H35.3231 OCT, Retina - OU - Both Eyes     Intravitreal Injection, Pharmacologic Agent - OD - Right Eye    Intravitreal Injection, Pharmacologic Agent - OS - Left Eye    faricimab -svoa (VABYSMO ) 6mg /0.21mL intravitreal injection    aflibercept  (EYLEA ) SOLN 2 mg    2. Essential hypertension  I10     3. Hypertensive retinopathy of both eyes  H35.033     4. Pseudophakia, both eyes  Z96.1      Exudative age related macular degeneration, OU  - pt delayed to follow up from 6 weeks to 4 months on 06.30.25  - pt delayed to follow up from 4 weeks to 8 on 11.14.23  - pt delayed to follow up from 6 weeks to 8 on 12.10.24 - FA (09.13.23) shows OD: Focal peripapillary CNV with leakage superior disc; OS: Central CNV with central blockage from heme, +leakage - s/p IVA OD #1 (09.13.23), #2 (11.14.23), #3 (12.12.23), #4 (01.09.24), #5 (02.06.24) - s/p IVA OS #1 (08.16.23), #2 (09.13.23), #3 (11.14.23), #4 (12.12.23), #5 (01.09.24) -- IVA resistance - s/p IVE OS #1 (02.06.24), #2 (03.12.24), #3 (04.16.24), #4 (05.28.24), #5 (07.16.24), # (08.29.24), #7 (10.15.24), #8 (12.10.24), #9 (sample 01.21.25), #10 (02.25.25)  **h/o increased edema at 7 wks noted on 07.16.24 exam**  - BCVA OD decreased to 20/30 from 20/20; OS decreased to 20/50 from 20/20  **discussed decreased efficacy / resistance to Eylea  and potential benefit of switching from Eylea  to Vabsymo* - OCT shows OD: central PED / CNV with interval re-development of SRF / SRHM overlying, Mild ERM; OS: Interval increase in central CNV / PED with increased SRF, SRHM and IRF overlying at 4 months since last IVE OS  - recommend IVE OD #1 and IVV OS #1 (SAMPLE) today, 06.30.25 w/ f/u in 4-6 wks -- [employer] funding unavailable - pt wishes to proceed with injection OU - pt covering 20% coinsurance on Eylea  - RBA of procedure discussed, questions answered - IVE informed consent obtained and signed, 02.06.24 (OU) - see procedure note - approved for Eylea  - pt covering 20% coinsurance - will get  approval for Vabysmo  for next visit  - f/u in 4-6 wks -- DFE/OCT/possible injection(s)  2,3. Hypertensive retinopathy OU - discussed importance of  tight BP control - continue to monitor  4. Pseudophakia OU  - s/p CE/IOL OU (Dr. Cleatus)  - IOLs in good position, doing well  - continue to monitor  Ophthalmic Meds Ordered this visit:  Meds ordered this encounter  Medications   faricimab -svoa (VABYSMO ) 6mg /0.67mL intravitreal injection   aflibercept  (EYLEA ) SOLN 2 mg     Return for f/u 4-6 weeks, exu ARMD OU, DFE, OCT, Possible Injxn.  There are no Patient Instructions on file for this visit.  Explained the diagnoses, plan, and follow up with the patient and they expressed understanding.  Patient expressed understanding of the importance of proper follow up care.   This document serves as a record of services personally performed by Redell JUDITHANN Hans, MD, PhD. It was created on their behalf by Avelina Pereyra, COA an ophthalmic technician. The creation of this record is the provider's dictation and/or activities during the visit.   Electronically signed by: Avelina GORMAN Pereyra, COT  06/11/24  11:06 AM   This document serves as a record of services personally performed by Redell JUDITHANN Hans, MD, PhD. It was created on their behalf by Alan PARAS. Delores, OA an ophthalmic technician. The creation of this record is the provider's dictation and/or activities during the visit.    Electronically signed by: Alan PARAS. Delores, OA 06/11/24 11:06 AM  Redell JUDITHANN Hans, M.D., Ph.D. Diseases & Surgery of the Retina and Vitreous [facility]  I have reviewed the above documentation for accuracy and completeness, and I agree with the above. Redell JUDITHANN Hans, M.D., Ph.D. 06/03/24 11:06 AM   Abbreviations: M myopia (nearsighted); A astigmatism; H hyperopia (farsighted); P presbyopia; Mrx spectacle prescription;  CTL contact lenses; OD right eye; OS left eye; OU both eyes  XT exotropia; ET  esotropia; PEK punctate epithelial keratitis; PEE punctate epithelial erosions; DES dry eye syndrome; MGD meibomian gland dysfunction; ATs artificial tears; PFAT's preservative free artificial tears; NSC nuclear sclerotic cataract; PSC posterior subcapsular cataract; ERM epi-retinal membrane; PVD posterior vitreous detachment; RD retinal detachment; DM diabetes mellitus; DR diabetic retinopathy; NPDR non-proliferative diabetic retinopathy; PDR proliferative diabetic retinopathy; CSME clinically significant macular edema; DME diabetic macular edema; dbh dot blot hemorrhages; CWS cotton wool spot; POAG primary open angle glaucoma; C/D cup-to-disc ratio; HVF humphrey visual field; GVF goldmann visual field; OCT optical coherence tomography; IOP intraocular pressure; BRVO Branch retinal vein occlusion; CRVO central retinal vein occlusion; CRAO central retinal artery occlusion; BRAO branch retinal artery occlusion; RT retinal tear; SB scleral buckle; PPV pars plana vitrectomy; VH Vitreous hemorrhage; PRP panretinal laser photocoagulation; IVK intravitreal kenalog; VMT vitreomacular traction; MH Macular hole;  NVD neovascularization of the disc; NVE neovascularization elsewhere; AREDS age related eye disease study; ARMD age related macular degeneration; POAG primary open angle glaucoma; EBMD epithelial/anterior basement membrane dystrophy; ACIOL anterior chamber intraocular lens; IOL intraocular lens; PCIOL posterior chamber intraocular lens; Phaco/IOL phacoemulsification with intraocular lens placement; PRK photorefractive keratectomy; LASIK laser assisted in situ keratomileusis; HTN hypertension; DM diabetes mellitus; COPD chronic obstructive pulmonary disease

## 2024-06-18 DIAGNOSIS — I739 Peripheral vascular disease, unspecified: Secondary | ICD-10-CM | POA: Diagnosis not present

## 2024-06-18 DIAGNOSIS — I70203 Unspecified atherosclerosis of native arteries of extremities, bilateral legs: Secondary | ICD-10-CM | POA: Diagnosis not present

## 2024-06-18 DIAGNOSIS — M19079 Primary osteoarthritis, unspecified ankle and foot: Secondary | ICD-10-CM | POA: Diagnosis not present

## 2024-06-18 DIAGNOSIS — L84 Corns and callosities: Secondary | ICD-10-CM | POA: Diagnosis not present

## 2024-06-18 DIAGNOSIS — M21611 Bunion of right foot: Secondary | ICD-10-CM | POA: Diagnosis not present

## 2024-06-18 DIAGNOSIS — M2041 Other hammer toe(s) (acquired), right foot: Secondary | ICD-10-CM | POA: Diagnosis not present

## 2024-06-18 DIAGNOSIS — B351 Tinea unguium: Secondary | ICD-10-CM | POA: Diagnosis not present

## 2024-07-02 NOTE — Progress Notes (Signed)
 [facility] - Clinic Note  07/16/2024    CHIEF COMPLAINT Patient presents for Retina Follow Up   HISTORY OF PRESENT ILLNESS: Audrey Sosa is a 89 y.o. female who presents to the clinic today for:   HPI     Retina Follow Up   Patient presents with  Wet AMD.  In both eyes.  This started 6 weeks ago.  Duration of 6 weeks.  Since onset it is stable.  I, the attending physician,  performed the HPI with the patient and updated documentation appropriately.        Comments   6 week retina follow up ARMD and I'VE OD and I'VE OS pt is reporting no vision changes noticed she denies any flashes or floaters       Last edited by Valdemar Rogue, MD on 07/16/2024  8:53 AM.    Pt states she can tell the new meds are helping.   Referring physician: Husain, Karrar, MD [address] [place],  [place] [address]  HISTORICAL INFORMATION:   Selected notes from the MEDICAL RECORD NUMBER Referred by Dr. Fleeta for ex ARMD LEE:  Ocular Hx- PMH-    CURRENT MEDICATIONS: No current outpatient medications on file. (Ophthalmic Drugs)   No current facility-administered medications for this visit. (Ophthalmic Drugs)   Current Outpatient Medications (Other)  Medication Sig   Cholecalciferol (VITAMIN D3) 1.25 MG (50000 UT) CAPS Take by mouth as directed.   Multiple Vitamins-Minerals (MULTIVITAMIN ADULT EXTRA C PO)    raloxifene (EVISTA) 60 MG tablet Take 60 mg by mouth daily.   simvastatin (ZOCOR) 20 MG tablet Take 20 mg by mouth daily.   valsartan-hydrochlorothiazide (DIOVAN-HCT) 160-12.5 MG tablet Take by mouth as directed.   No current facility-administered medications for this visit. (Other)   REVIEW OF SYSTEMS: ROS   Positive for: Eyes Negative for: Constitutional, Gastrointestinal, Neurological, Skin, Genitourinary, Musculoskeletal, HENT, Endocrine, Cardiovascular, Respiratory, Psychiatric, Allergic/Imm, Heme/Lymph Last edited by Resa Delon ORN, COT on  07/16/2024  8:13 AM.      ALLERGIES Not on File  PAST MEDICAL HISTORY History reviewed. No pertinent past medical history. Past Surgical History:  Procedure Laterality Date   CATARACT EXTRACTION     FAMILY HISTORY Family History  Problem Relation Age of Onset   Macular degeneration Mother    Macular degeneration Father    SOCIAL HISTORY Social History   Tobacco Use   Smoking status: Never   Smokeless tobacco: Never  Vaping Use   Vaping status: Never Used       OPHTHALMIC EXAM:  Base Eye Exam     Visual Acuity (Snellen - Linear)       Right Left   Dist [place] 20/30 -2 20/50 -2   Dist ph [place] 20/25 -3    Dist ph cc  NI         Tonometry (Tonopen, 8:18 AM)       Right Left   Pressure 13 12         Pupils       Pupils Dark Light Shape React APD   Right PERRL 3 2 Round Brisk None   Left PERRL 3 2 Round Brisk None         Visual Fields       Left Right    Full Full         Extraocular Movement       Right Left    Full, Ortho Full, Ortho  Neuro/Psych     Oriented x3: Yes   Mood/Affect: Normal         Dilation     Both eyes: 2.5% Phenylephrine @ 8:18 AM           Slit Lamp and Fundus Exam     Slit Lamp Exam       Right Left   Lids/Lashes Dermatochalasis - upper lid Dermatochalasis - upper lid   Conjunctiva/Sclera White and quiet White and quiet   Cornea arcus, trace PEE, mild tear film debris arcus, trace PEE, mild tear film debris   Anterior Chamber Deep and quiet Deep and quiet   Iris Round and dilated Round and dilated   Lens PC IOL in good position, trace Posterior capsular opacification PC IOL in good position, 1+ Posterior capsular opacification   Anterior Vitreous Vitreous syneresis Vitreous syneresis         Fundus Exam       Right Left   Disc Pink and Sharp, Compact, temporal PPA, peripapillary CNV with edema superior disc -- stably improved, no heme mild Pallor, Sharp rim, PPA   C/D Ratio 0.5 0.6    Macula Flat, Blunted foveal reflex, fine Drusen, RPE mottling and clumping, central CNV with interval improvement in SRF / edema Blunted foveal reflex, central CNV with new SRH/edema/cystic changes--improving, Drusen, RPE mottling   Vessels attenuated, Tortuous attenuated, Tortuous   Periphery Attached, mild reticular degeneration, No heme Attached, mild reticular degeneration, No heme, focal peripheral drusen temporal periphery           IMAGING AND PROCEDURES  Imaging and Procedures for 07/16/2024  OCT, Retina - OU - Both Eyes       Right Eye Quality was poor. Central Foveal Thickness: 323. Progression has improved. Findings include no IRF, abnormal foveal contour, retinal drusen , subretinal hyper-reflective material, choroidal neovascular membrane, epiretinal membrane, pigment epithelial detachment, subretinal fluid (Persistent central PED / CNV with SRF / SRHM overlying--SRF slightly improved, Mild ERM).   Left Eye Quality was good. Central Foveal Thickness: 285. Progression has improved. Findings include abnormal foveal contour, subretinal hyper-reflective material, intraretinal hyper-reflective material, intraretinal fluid, pigment epithelial detachment, subretinal fluid (Interval improvement central CNV / PED with improved SRF, SRHM and IRF overlying).   Notes *Images captured and stored on drive  Diagnosis / Impression:  Exu ARMD OU OD: Persistent central PED / CNV with SRF / SRHM overlying--SRF slightly improved, Mild ERM OS: Interval improvement central CNV / PED with improved SRF, SRHM and IRF overlying  Clinical management:  See below  Abbreviations: NFP - Normal foveal profile. CME - cystoid macular edema. PED - pigment epithelial detachment. IRF - intraretinal fluid. SRF - subretinal fluid. EZ - ellipsoid zone. ERM - epiretinal membrane. ORA - outer retinal atrophy. ORT - outer retinal tubulation. SRHM - subretinal hyper-reflective material. IRHM - intraretinal  hyper-reflective material      Intravitreal Injection, Pharmacologic Agent - OD - Right Eye       Time Out 07/16/2024. 8:39 AM. Confirmed correct patient, procedure, site, and patient consented.   Anesthesia Topical anesthesia was used. Anesthetic medications included Lidocaine 2%, Proparacaine 0.5%.   Procedure Preparation included 5% betadine to ocular surface, eyelid speculum. A (32g) needle was used.   Injection: 2 mg aflibercept  2 MG/0.05ML   Route: Intravitreal, Site: Right Eye   NDC: Q956576, Lot: [id number], Expiration date: 10/04/2025, Waste: 0 mL   Post-op Post injection exam found visual acuity of at least counting fingers. The patient tolerated the  procedure well. There were no complications. The patient received written and verbal post procedure care education. Post injection medications were not given.      Intravitreal Injection, Pharmacologic Agent - OS - Left Eye       Time Out 07/16/2024. 8:40 AM. Confirmed correct patient, procedure, site, and patient consented.   Anesthesia Topical anesthesia was used. Anesthetic medications included Lidocaine 2%, Proparacaine 0.5%.   Procedure Preparation included 5% betadine to ocular surface, eyelid speculum. A (32g) needle was used.   Injection: 6 mg faricimab -svoa 6 MG/0.05ML (Patient supplied)   Route: Intravitreal, Site: Left Eye   NDC: 49757-903-98, Lot: [id number], Expiration date: 09/03/2026, Waste: 0 mL   Post-op Post injection exam found visual acuity of at least counting fingers. The patient tolerated the procedure well. There were no complications. The patient received written and verbal post procedure care education. Post injection medications were not given.   Notes **SAMPLE MEDICATION ADMINISTERED**            ASSESSMENT/PLAN:    ICD-10-CM   1. Exudative age-related macular degeneration of both eyes with active choroidal neovascularization (HCC)  H35.3231 OCT, Retina - OU - Both Eyes     Intravitreal Injection, Pharmacologic Agent - OD - Right Eye    Intravitreal Injection, Pharmacologic Agent - OS - Left Eye    faricimab -svoa (VABYSMO ) 6mg /0.21mL intravitreal injection    aflibercept  (EYLEA ) SOLN 2 mg    2. Essential hypertension  I10     3. Hypertensive retinopathy of both eyes  H35.033     4. Pseudophakia, both eyes  Z96.1       Exudative age related macular degeneration, OU  - pt delayed to follow up from 6 weeks to 4 months on 06.30.25  - pt delayed to follow up from 4 weeks to 8 on 11.14.23  - pt delayed to follow up from 6 weeks to 8 on 12.10.24 - FA (09.13.23) shows OD: Focal peripapillary CNV with leakage superior disc; OS: Central CNV with central blockage from heme, +leakage - s/p IVA OD #1 (09.13.23), #2 (11.14.23), #3 (12.12.23), #4 (01.09.24), #5 (02.06.24) - s/p IVA OS #1 (08.16.23), #2 (09.13.23), #3 (11.14.23), #4 (12.12.23), #5 (01.09.24) -- IVA resistance - s/p IVE OS #1 (02.06.24), #2 (03.12.24), #3 (04.16.24), #4 (05.28.24), #5 (07.16.24), # (08.29.24), #7 (10.15.24), #8 (12.10.24), #9 (sample 01.21.25), #10 (02.25.25) -- IVE resistance ========== - s/p IVE OD #1 (06.30.25)  - s/p IVV OS #1 (SAMPLE -- 06.30.25) ========== **h/o increased edema at 7 wks noted on 07.16.24 exam**  - BCVA OD decreased to 20/30 from 20/20; OS decreased to 20/50 from 20/20  **discussed decreased efficacy / resistance to Eylea  and potential benefit of switching from Eylea  to Vabsymo* - OCT shows OD: Persistent central PED / CNV with SRF / SRHM overlying--SRF slightly improved, Mild ERM; OS: Interval improvement central CNV / PED with increased SRF, SRHM and IRF overlying at 6 weeks  - recommend IVE OD #2 and IVV OS #2 (SAMPLE) today, 08.12.25 w/ f/u in 6 wks -- [employer] funding unavailable - pt wishes to proceed with injection OU - pt covering 20% coinsurance on Eylea  - RBA of procedure discussed, questions answered - IVE informed consent obtained and signed, 02.06.24  (OU) - see procedure note - approved for Eylea  - pt covering 20% coinsurance  - f/u in 6 wks -- DFE/OCT/possible injection(s)  2,3. Hypertensive retinopathy OU - discussed importance of tight BP control - continue to monitor  4. Pseudophakia OU  -  s/p CE/IOL OU (Dr. Cleatus)  - IOLs in good position, doing well  - continue to monitor  Ophthalmic Meds Ordered this visit:  Meds ordered this encounter  Medications   faricimab -svoa (VABYSMO ) 6mg /0.54mL intravitreal injection   aflibercept  (EYLEA ) SOLN 2 mg     Return in about 6 weeks (around 08/27/2024) for exu ARMD OU, DFE, OCT.  There are no Patient Instructions on file for this visit.  Explained the diagnoses, plan, and follow up with the patient and they expressed understanding.  Patient expressed understanding of the importance of proper follow up care.   This document serves as a record of services personally performed by Redell JUDITHANN Hans, MD, PhD. It was created on their behalf by Avelina Pereyra, COA an ophthalmic technician. The creation of this record is the provider's dictation and/or activities during the visit.   Electronically signed by: Avelina GORMAN Pereyra, COT  07/21/24  12:12 AM   Redell JUDITHANN Hans, M.D., Ph.D. Diseases & Surgery of the Retina and Vitreous [facility]  I have reviewed the above documentation for accuracy and completeness, and I agree with the above. Redell JUDITHANN Hans, M.D., Ph.D. 07/21/24 12:24 AM   Abbreviations: M myopia (nearsighted); A astigmatism; H hyperopia (farsighted); P presbyopia; Mrx spectacle prescription;  CTL contact lenses; OD right eye; OS left eye; OU both eyes  XT exotropia; ET esotropia; PEK punctate epithelial keratitis; PEE punctate epithelial erosions; DES dry eye syndrome; MGD meibomian gland dysfunction; ATs artificial tears; PFAT's preservative free artificial tears; NSC nuclear sclerotic cataract; PSC posterior subcapsular cataract; ERM epi-retinal membrane; PVD  posterior vitreous detachment; RD retinal detachment; DM diabetes mellitus; DR diabetic retinopathy; NPDR non-proliferative diabetic retinopathy; PDR proliferative diabetic retinopathy; CSME clinically significant macular edema; DME diabetic macular edema; dbh dot blot hemorrhages; CWS cotton wool spot; POAG primary open angle glaucoma; C/D cup-to-disc ratio; HVF humphrey visual field; GVF goldmann visual field; OCT optical coherence tomography; IOP intraocular pressure; BRVO Branch retinal vein occlusion; CRVO central retinal vein occlusion; CRAO central retinal artery occlusion; BRAO branch retinal artery occlusion; RT retinal tear; SB scleral buckle; PPV pars plana vitrectomy; VH Vitreous hemorrhage; PRP panretinal laser photocoagulation; IVK intravitreal kenalog; VMT vitreomacular traction; MH Macular hole;  NVD neovascularization of the disc; NVE neovascularization elsewhere; AREDS age related eye disease study; ARMD age related macular degeneration; POAG primary open angle glaucoma; EBMD epithelial/anterior basement membrane dystrophy; ACIOL anterior chamber intraocular lens; IOL intraocular lens; PCIOL posterior chamber intraocular lens; Phaco/IOL phacoemulsification with intraocular lens placement; PRK photorefractive keratectomy; LASIK laser assisted in situ keratomileusis; HTN hypertension; DM diabetes mellitus; COPD chronic obstructive pulmonary disease

## 2024-07-04 DIAGNOSIS — E782 Mixed hyperlipidemia: Secondary | ICD-10-CM | POA: Diagnosis not present

## 2024-07-04 DIAGNOSIS — E7800 Pure hypercholesterolemia, unspecified: Secondary | ICD-10-CM | POA: Diagnosis not present

## 2024-07-04 DIAGNOSIS — I10 Essential (primary) hypertension: Secondary | ICD-10-CM | POA: Diagnosis not present

## 2024-07-04 DIAGNOSIS — M810 Age-related osteoporosis without current pathological fracture: Secondary | ICD-10-CM | POA: Diagnosis not present

## 2024-07-16 ENCOUNTER — Encounter (INDEPENDENT_AMBULATORY_CARE_PROVIDER_SITE_OTHER): Payer: Self-pay | Admitting: Ophthalmology

## 2024-07-16 ENCOUNTER — Ambulatory Visit (INDEPENDENT_AMBULATORY_CARE_PROVIDER_SITE_OTHER): Admitting: Ophthalmology

## 2024-07-16 DIAGNOSIS — H35033 Hypertensive retinopathy, bilateral: Secondary | ICD-10-CM | POA: Diagnosis not present

## 2024-07-16 DIAGNOSIS — I10 Essential (primary) hypertension: Secondary | ICD-10-CM | POA: Diagnosis not present

## 2024-07-16 DIAGNOSIS — H353231 Exudative age-related macular degeneration, bilateral, with active choroidal neovascularization: Secondary | ICD-10-CM | POA: Diagnosis not present

## 2024-07-16 DIAGNOSIS — Z961 Presence of intraocular lens: Secondary | ICD-10-CM

## 2024-07-16 MED ORDER — FARICIMAB-SVOA 6 MG/0.05ML IZ SOLN
6.0000 mg | INTRAVITREAL | Status: AC | PRN
  Administered 2024-07-16 (×2): 6 mg via INTRAVITREAL

## 2024-07-16 MED ORDER — AFLIBERCEPT 2MG/0.05ML IZ SOLN FOR KALEIDOSCOPE
2.0000 mg | INTRAVITREAL | Status: AC | PRN
  Administered 2024-07-16 (×2): 2 mg via INTRAVITREAL

## 2024-08-04 DIAGNOSIS — I10 Essential (primary) hypertension: Secondary | ICD-10-CM | POA: Diagnosis not present

## 2024-08-04 DIAGNOSIS — E782 Mixed hyperlipidemia: Secondary | ICD-10-CM | POA: Diagnosis not present

## 2024-08-04 DIAGNOSIS — M810 Age-related osteoporosis without current pathological fracture: Secondary | ICD-10-CM | POA: Diagnosis not present

## 2024-08-04 DIAGNOSIS — E7800 Pure hypercholesterolemia, unspecified: Secondary | ICD-10-CM | POA: Diagnosis not present

## 2024-08-27 NOTE — Progress Notes (Signed)
 [facility] - Clinic Note  09/10/2024    CHIEF COMPLAINT Patient presents for Retina Follow Up   HISTORY OF PRESENT ILLNESS: Audrey Sosa is a 89 y.o. female who presents to the clinic today for:   HPI     Retina Follow Up   Patient presents with  Wet AMD.  In both eyes.  This started 6 weeks ago.  I, the attending physician,  performed the HPI with the patient and updated documentation appropriately.        Comments   Patient here for 6 weeks retina follow up for exu ARMD OU. Patient states vision doing pretty good. No eye pain.       Last edited by Valdemar Rogue, MD on 09/10/2024  1:19 PM.     Pt states   Referring physician: Ransom Other, MD [address] [place],  [place] [address]  HISTORICAL INFORMATION:   Selected notes from the MEDICAL RECORD NUMBER Referred by Dr. Fleeta for ex ARMD LEE:  Ocular Hx- PMH-    CURRENT MEDICATIONS: No current outpatient medications on file. (Ophthalmic Drugs)   No current facility-administered medications for this visit. (Ophthalmic Drugs)   Current Outpatient Medications (Other)  Medication Sig   Cholecalciferol (VITAMIN D3) 1.25 MG (50000 UT) CAPS Take by mouth as directed.   Multiple Vitamins-Minerals (MULTIVITAMIN ADULT EXTRA C PO)    raloxifene (EVISTA) 60 MG tablet Take 60 mg by mouth daily.   simvastatin (ZOCOR) 20 MG tablet Take 20 mg by mouth daily.   valsartan-hydrochlorothiazide (DIOVAN-HCT) 160-12.5 MG tablet Take by mouth as directed.   No current facility-administered medications for this visit. (Other)   REVIEW OF SYSTEMS: ROS   Positive for: Eyes Negative for: Constitutional, Gastrointestinal, Neurological, Skin, Genitourinary, Musculoskeletal, HENT, Endocrine, Cardiovascular, Respiratory, Psychiatric, Allergic/Imm, Heme/Lymph Last edited by Orval Asberry GORMAN, COA on 09/10/2024  9:24 AM.     ALLERGIES Not on File  PAST MEDICAL HISTORY History reviewed. No pertinent  past medical history. Past Surgical History:  Procedure Laterality Date   CATARACT EXTRACTION     FAMILY HISTORY Family History  Problem Relation Age of Onset   Macular degeneration Mother    Macular degeneration Father    SOCIAL HISTORY Social History   Tobacco Use   Smoking status: Never   Smokeless tobacco: Never  Vaping Use   Vaping status: Never Used       OPHTHALMIC EXAM:  Base Eye Exam     Visual Acuity (Snellen - Linear)       Right Left   Dist [place] 20/30 -1 20/50 +2   Dist ph [place] 20/25 -2 NI         Tonometry (Tonopen, 9:22 AM)       Right Left   Pressure 15 14         Pupils       Dark Light Shape React APD   Right 3 2 Round Brisk None   Left 3 2 Round Brisk None         Visual Fields (Counting fingers)       Left Right    Full Full         Extraocular Movement       Right Left    Full, Ortho Full, Ortho         Neuro/Psych     Oriented x3: Yes   Mood/Affect: Normal         Dilation  Both eyes: 1.0% Mydriacyl, 2.5% Phenylephrine @ 9:22 AM           Slit Lamp and Fundus Exam     Slit Lamp Exam       Right Left   Lids/Lashes Dermatochalasis - upper lid Dermatochalasis - upper lid   Conjunctiva/Sclera White and quiet White and quiet   Cornea arcus, trace PEE, mild tear film debris arcus, trace PEE, mild tear film debris   Anterior Chamber Deep and quiet Deep and quiet   Iris Round and dilated Round and dilated   Lens PC IOL in good position, trace Posterior capsular opacification PC IOL in good position, 1+ Posterior capsular opacification   Anterior Vitreous Vitreous syneresis Vitreous syneresis         Fundus Exam       Right Left   Disc Pink and Sharp, Compact, temporal PPA, peripapillary CNV with heme and edema superior disc--persistent mild Pallor, Sharp rim, PPA   C/D Ratio 0.5 0.6   Macula Flat, Blunted foveal reflex, fine Drusen, RPE mottling and clumping, central CNV with mild interal increase in  SRF / edema Blunted foveal reflex, central CNV with SRH/edema/cystic changes--improving, Drusen, RPE mottling   Vessels attenuated, Tortuous attenuated, Tortuous   Periphery Attached, mild reticular degeneration, No heme Attached, mild reticular degeneration, No heme, focal peripheral drusen temporal periphery           IMAGING AND PROCEDURES  Imaging and Procedures for 09/10/2024  OCT, Retina - OU - Both Eyes       Right Eye Quality was poor. Central Foveal Thickness: 378. Progression has worsened. Findings include no IRF, abnormal foveal contour, retinal drusen , subretinal hyper-reflective material, choroidal neovascular membrane, epiretinal membrane, pigment epithelial detachment, subretinal fluid (Mild interval increase in central PED / CNV with SRF / SRHM overlying--SRF slightly increased, Mild ERM).   Left Eye Quality was good. Central Foveal Thickness: 268. Progression has improved. Findings include no SRF, abnormal foveal contour, subretinal hyper-reflective material, intraretinal hyper-reflective material, intraretinal fluid, pigment epithelial detachment (Interval improvement in central CNV / PED with improved SRF, SRHM and IRF overlying).   Notes *Images captured and stored on drive  Diagnosis / Impression:  Exu ARMD OU OD: Mild interval increase in central PED / CNV with SRF / SRHM overlying--SRF slightly increased, Mild ERM OS: Interval improvement central CNV / PED with improved SRF, SRHM and IRF overlying  Clinical management:  See below  Abbreviations: NFP - Normal foveal profile. CME - cystoid macular edema. PED - pigment epithelial detachment. IRF - intraretinal fluid. SRF - subretinal fluid. EZ - ellipsoid zone. ERM - epiretinal membrane. ORA - outer retinal atrophy. ORT - outer retinal tubulation. SRHM - subretinal hyper-reflective material. IRHM - intraretinal hyper-reflective material      Intravitreal Injection, Pharmacologic Agent - OD - Right Eye        Time Out 09/10/2024. 10:27 AM. Confirmed correct patient, procedure, site, and patient consented.   Anesthesia Topical anesthesia was used. Anesthetic medications included Lidocaine 2%, Proparacaine 0.5%.   Procedure Preparation included 5% betadine to ocular surface, eyelid speculum. A (32g) needle was used.   Injection: 2 mg aflibercept  2 MG/0.05ML   Route: Intravitreal, Site: Right Eye   NDC: Q956576, Lot: [id number], Expiration date: 11/03/2025, Waste: 0 mL   Post-op Post injection exam found visual acuity of at least counting fingers. The patient tolerated the procedure well. There were no complications. The patient received written and verbal post procedure care education. Post injection medications  were not given.      Intravitreal Injection, Pharmacologic Agent - OS - Left Eye       Time Out 09/10/2024. 10:27 AM. Confirmed correct patient, procedure, site, and patient consented.   Anesthesia Topical anesthesia was used. Anesthetic medications included Lidocaine 2%, Proparacaine 0.5%.   Procedure Preparation included 5% betadine to ocular surface, eyelid speculum. A (32g) needle was used.   Injection: 6 mg faricimab -svoa 6 MG/0.05ML (Patient supplied)   Route: Intravitreal, Site: Left Eye   NDC: 49757-903-98, Lot: [id number], Expiration date: 09/03/2026, Waste: 0 mL   Post-op Post injection exam found visual acuity of at least counting fingers. The patient tolerated the procedure well. There were no complications. The patient received written and verbal post procedure care education. Post injection medications were not given.   Notes **SAMPLE MEDICATION ADMINISTERED**           ASSESSMENT/PLAN:    ICD-10-CM   1. Exudative age-related macular degeneration of both eyes with active choroidal neovascularization (HCC)  H35.3231 OCT, Retina - OU - Both Eyes    Intravitreal Injection, Pharmacologic Agent - OD - Right Eye    Intravitreal Injection, Pharmacologic  Agent - OS - Left Eye    faricimab -svoa (VABYSMO ) 6mg /0.2mL intravitreal injection    aflibercept  (EYLEA ) SOLN 2 mg    2. Essential hypertension  I10     3. Hypertensive retinopathy of both eyes  H35.033     4. Pseudophakia, both eyes  Z96.1      Exudative age related macular degeneration, OU  - pt delayed to follow up from 6 weeks to 4 months on 06.30.25  - pt delayed to follow up from 4 weeks to 8 on 11.14.23  - pt delayed to follow up from 6 weeks to 8 on 12.10.24 - FA (09.13.23) shows OD: Focal peripapillary CNV with leakage superior disc; OS: Central CNV with central blockage from heme, +leakage - s/p IVA OD #1 (09.13.23), #2 (11.14.23), #3 (12.12.23), #4 (01.09.24), #5 (02.06.24) - s/p IVA OS #1 (08.16.23), #2 (09.13.23), #3 (11.14.23), #4 (12.12.23), #5 (01.09.24) -- IVA resistance - s/p IVE OS #1 (02.06.24), #2 (03.12.24), #3 (04.16.24), #4 (05.28.24), #5 (07.16.24), # (08.29.24), #7 (10.15.24), #8 (12.10.24), #9 (sample 01.21.25), #10 (02.25.25) -- IVE resistance ========== - s/p IVE OD #1 (06.30.25), #2 (08.12.25) - s/p IVV OS #1 (SAMPLE -- 06.30.25), #2 (08.12.25) ========== **h/o increased edema at 7 wks noted on 07.16.24 exam**  - BCVA OD 20/25 improved from 20/30; OS 20/50  stable  **discussed decreased efficacy / resistance to Eylea  and potential benefit of switching from Eylea  to Vabsymo* - OCT shows NI:Fpoi interval increase in central PED / CNV with SRF / SRHM overlying--SRF slightly increased, Mild ERM; OS: Interval improvement central CNV / PED with improved SRF, SRHM and IRF overlying at 6 weeks  - recommend IVE OD #3 and IVV OS #3 (SAMPLE) today, 08.12.25 w/ f/u in 6 wks -- [employer] funding unavailable - pt wishes to proceed with injection OU - pt covering 20% coinsurance on Eylea  - RBA of procedure discussed, questions answered - IVE informed consent obtained and signed, 02.06.24 (OU) - see procedure note - approved for Eylea  - pt covering 20% coinsurance  -  f/u in 6 wks -- DFE/OCT/possible injection(s)  2,3. Hypertensive retinopathy OU - discussed importance of tight BP control - continue to monitor  4. Pseudophakia OU  - s/p CE/IOL OU (Dr. Cleatus)  - IOLs in good position, doing well  - continue to monitor  Ophthalmic Meds Ordered this visit:  Meds ordered this encounter  Medications   faricimab -svoa (VABYSMO ) 6mg /0.13mL intravitreal injection   aflibercept  (EYLEA ) SOLN 2 mg     Return in about 6 weeks (around 10/22/2024) for exu ARMD OU, DFE, OCT, Possible Injxn.  There are no Patient Instructions on file for this visit.  Explained the diagnoses, plan, and follow up with the patient and they expressed understanding.  Patient expressed understanding of the importance of proper follow up care.   This document serves as a record of services personally performed by Redell JUDITHANN Hans, MD, PhD. It was created on their behalf by Wanda GEANNIE Keens, COT an ophthalmic technician. The creation of this record is the provider's dictation and/or activities during the visit.    Electronically signed by:  Wanda GEANNIE Keens, COT  09/15/24 2:21 AM   This document serves as a record of services personally performed by Redell JUDITHANN Hans, MD, PhD. It was created on their behalf by Almetta Pesa, an ophthalmic technician. The creation of this record is the provider's dictation and/or activities during the visit.    Electronically signed by: Almetta Pesa, OA, 09/15/24  2:21 AM   Redell JUDITHANN Hans, M.D., Ph.D. Diseases & Surgery of the Retina and Vitreous [facility]  I have reviewed the above documentation for accuracy and completeness, and I agree with the above. Redell JUDITHANN Hans, M.D., Ph.D. 09/15/24 2:23 AM   Abbreviations: M myopia (nearsighted); A astigmatism; H hyperopia (farsighted); P presbyopia; Mrx spectacle prescription;  CTL contact lenses; OD right eye; OS left eye; OU both eyes  XT exotropia; ET esotropia; PEK  punctate epithelial keratitis; PEE punctate epithelial erosions; DES dry eye syndrome; MGD meibomian gland dysfunction; ATs artificial tears; PFAT's preservative free artificial tears; NSC nuclear sclerotic cataract; PSC posterior subcapsular cataract; ERM epi-retinal membrane; PVD posterior vitreous detachment; RD retinal detachment; DM diabetes mellitus; DR diabetic retinopathy; NPDR non-proliferative diabetic retinopathy; PDR proliferative diabetic retinopathy; CSME clinically significant macular edema; DME diabetic macular edema; dbh dot blot hemorrhages; CWS cotton wool spot; POAG primary open angle glaucoma; C/D cup-to-disc ratio; HVF humphrey visual field; GVF goldmann visual field; OCT optical coherence tomography; IOP intraocular pressure; BRVO Branch retinal vein occlusion; CRVO central retinal vein occlusion; CRAO central retinal artery occlusion; BRAO branch retinal artery occlusion; RT retinal tear; SB scleral buckle; PPV pars plana vitrectomy; VH Vitreous hemorrhage; PRP panretinal laser photocoagulation; IVK intravitreal kenalog; VMT vitreomacular traction; MH Macular hole;  NVD neovascularization of the disc; NVE neovascularization elsewhere; AREDS age related eye disease study; ARMD age related macular degeneration; POAG primary open angle glaucoma; EBMD epithelial/anterior basement membrane dystrophy; ACIOL anterior chamber intraocular lens; IOL intraocular lens; PCIOL posterior chamber intraocular lens; Phaco/IOL phacoemulsification with intraocular lens placement; PRK photorefractive keratectomy; LASIK laser assisted in situ keratomileusis; HTN hypertension; DM diabetes mellitus; COPD chronic obstructive pulmonary disease

## 2024-08-29 ENCOUNTER — Encounter (INDEPENDENT_AMBULATORY_CARE_PROVIDER_SITE_OTHER): Admitting: Ophthalmology

## 2024-09-03 DIAGNOSIS — E782 Mixed hyperlipidemia: Secondary | ICD-10-CM | POA: Diagnosis not present

## 2024-09-03 DIAGNOSIS — E7800 Pure hypercholesterolemia, unspecified: Secondary | ICD-10-CM | POA: Diagnosis not present

## 2024-09-03 DIAGNOSIS — I10 Essential (primary) hypertension: Secondary | ICD-10-CM | POA: Diagnosis not present

## 2024-09-03 DIAGNOSIS — M810 Age-related osteoporosis without current pathological fracture: Secondary | ICD-10-CM | POA: Diagnosis not present

## 2024-09-10 ENCOUNTER — Ambulatory Visit (INDEPENDENT_AMBULATORY_CARE_PROVIDER_SITE_OTHER): Admitting: Ophthalmology

## 2024-09-10 ENCOUNTER — Encounter (INDEPENDENT_AMBULATORY_CARE_PROVIDER_SITE_OTHER): Payer: Self-pay | Admitting: Ophthalmology

## 2024-09-10 DIAGNOSIS — I10 Essential (primary) hypertension: Secondary | ICD-10-CM

## 2024-09-10 DIAGNOSIS — H353231 Exudative age-related macular degeneration, bilateral, with active choroidal neovascularization: Secondary | ICD-10-CM | POA: Diagnosis not present

## 2024-09-10 DIAGNOSIS — Z961 Presence of intraocular lens: Secondary | ICD-10-CM

## 2024-09-10 DIAGNOSIS — H35033 Hypertensive retinopathy, bilateral: Secondary | ICD-10-CM

## 2024-09-10 MED ORDER — AFLIBERCEPT 2MG/0.05ML IZ SOLN FOR KALEIDOSCOPE
2.0000 mg | INTRAVITREAL | Status: AC | PRN
  Administered 2024-09-10: 2 mg via INTRAVITREAL

## 2024-09-10 MED ORDER — FARICIMAB-SVOA 6 MG/0.05ML IZ SOLN
6.0000 mg | INTRAVITREAL | Status: AC | PRN
  Administered 2024-09-10: 6 mg via INTRAVITREAL

## 2024-09-12 DIAGNOSIS — M810 Age-related osteoporosis without current pathological fracture: Secondary | ICD-10-CM | POA: Diagnosis not present

## 2024-09-12 DIAGNOSIS — E782 Mixed hyperlipidemia: Secondary | ICD-10-CM | POA: Diagnosis not present

## 2024-09-12 DIAGNOSIS — Z0000 Encounter for general adult medical examination without abnormal findings: Secondary | ICD-10-CM | POA: Diagnosis not present

## 2024-09-12 DIAGNOSIS — I10 Essential (primary) hypertension: Secondary | ICD-10-CM | POA: Diagnosis not present

## 2024-09-12 DIAGNOSIS — H3530 Unspecified macular degeneration: Secondary | ICD-10-CM | POA: Diagnosis not present

## 2024-09-12 DIAGNOSIS — Z23 Encounter for immunization: Secondary | ICD-10-CM | POA: Diagnosis not present

## 2024-09-12 DIAGNOSIS — E559 Vitamin D deficiency, unspecified: Secondary | ICD-10-CM | POA: Diagnosis not present

## 2024-10-08 NOTE — Progress Notes (Signed)
 [facility] - Clinic Note  10/22/2024    CHIEF COMPLAINT Patient presents for Retina Follow Up   HISTORY OF PRESENT ILLNESS: Audrey Sosa is a 89 y.o. female who presents to the clinic today for:   HPI     Retina Follow Up   Patient presents with  Wet AMD.  In both eyes.  This started 6 weeks ago.  Duration of 6 weeks.  Since onset it is stable.  I, the attending physician,  performed the HPI with the patient and updated documentation appropriately.        Comments   6 week retina follow up AMD OU and IVE OD and IVV OS pt is reporting no vision changes noticed she denies any flashes or floaters       Last edited by Valdemar Rogue, MD on 10/22/2024 12:40 PM.      Pt states   Referring physician: Husain, Karrar, MD [address] [place],  [place] [address]  HISTORICAL INFORMATION:   Selected notes from the MEDICAL RECORD NUMBER Referred by Dr. Fleeta for ex ARMD LEE:  Ocular Hx- PMH-    CURRENT MEDICATIONS: No current outpatient medications on file. (Ophthalmic Drugs)   No current facility-administered medications for this visit. (Ophthalmic Drugs)   Current Outpatient Medications (Other)  Medication Sig   Cholecalciferol (VITAMIN D3) 1.25 MG (50000 UT) CAPS Take by mouth as directed.   Multiple Vitamins-Minerals (MULTIVITAMIN ADULT EXTRA C PO)    raloxifene (EVISTA) 60 MG tablet Take 60 mg by mouth daily.   simvastatin (ZOCOR) 20 MG tablet Take 20 mg by mouth daily.   valsartan-hydrochlorothiazide (DIOVAN-HCT) 160-12.5 MG tablet Take by mouth as directed.   No current facility-administered medications for this visit. (Other)   REVIEW OF SYSTEMS: ROS   Positive for: Eyes Negative for: Constitutional, Gastrointestinal, Neurological, Skin, Genitourinary, Musculoskeletal, HENT, Endocrine, Cardiovascular, Respiratory, Psychiatric, Allergic/Imm, Heme/Lymph Last edited by Resa Delon ORN, COT on 10/22/2024  9:15 AM.       ALLERGIES Not on File  PAST MEDICAL HISTORY History reviewed. No pertinent past medical history. Past Surgical History:  Procedure Laterality Date   CATARACT EXTRACTION     FAMILY HISTORY Family History  Problem Relation Age of Onset   Macular degeneration Mother    Macular degeneration Father    SOCIAL HISTORY Social History   Tobacco Use   Smoking status: Never   Smokeless tobacco: Never  Vaping Use   Vaping status: Never Used       OPHTHALMIC EXAM:  Base Eye Exam     Visual Acuity (Snellen - Linear)       Right Left   Dist [place] 20/40 -2 20/50   Dist ph [place] 20/30 -2 20/40         Tonometry (Tonopen, 9:19 AM)       Right Left   Pressure 16 15         Pupils       Pupils Dark Light Shape React APD   Right PERRL 3 2 Round Brisk None   Left PERRL 3 2 Round Brisk None         Visual Fields       Left Right    Full Full         Extraocular Movement       Right Left    Full, Ortho Full, Ortho         Neuro/Psych     Oriented x3:  Yes   Mood/Affect: Normal         Dilation     Both eyes: 2.5% Phenylephrine @ 9:19 AM           Slit Lamp and Fundus Exam     Slit Lamp Exam       Right Left   Lids/Lashes Dermatochalasis - upper lid Dermatochalasis - upper lid   Conjunctiva/Sclera White and quiet White and quiet   Cornea arcus, trace PEE, mild tear film debris arcus, trace PEE, mild tear film debris   Anterior Chamber Deep and quiet Deep and quiet   Iris Round and dilated Round and dilated   Lens PC IOL in good position, trace Posterior capsular opacification PC IOL in good position, 1+ Posterior capsular opacification   Anterior Vitreous Vitreous syneresis Vitreous syneresis         Fundus Exam       Right Left   Disc Pink and Sharp, Compact, temporal PPA, peripapillary CNV with heme and edema superior disc--persistent mild Pallor, Sharp rim, PPA   C/D Ratio 0.5 0.6   Macula Flat, Blunted foveal reflex, fine Drusen,  RPE mottling and clumping, central CNV with persistent SRF / edema Blunted foveal reflex, central CNV with SRH/edema/cystic changes--improving, Drusen, RPE mottling   Vessels attenuated, Tortuous attenuated, Tortuous   Periphery Attached, mild reticular degeneration, No heme Attached, mild reticular degeneration, No heme, focal peripheral drusen temporal periphery           IMAGING AND PROCEDURES  Imaging and Procedures for 10/22/2024  OCT, Retina - OU - Both Eyes       Right Eye Quality was good. Central Foveal Thickness: 363. Progression has been stable. Findings include no IRF, abnormal foveal contour, retinal drusen , subretinal hyper-reflective material, choroidal neovascular membrane, epiretinal membrane, pigment epithelial detachment, subretinal fluid (Persistent central PED / CNV with SRF / SRHM overlying, Mild ERM).   Left Eye Quality was good. Central Foveal Thickness: 222. Progression has improved. Findings include no SRF, abnormal foveal contour, subretinal hyper-reflective material, intraretinal hyper-reflective material, intraretinal fluid, pigment epithelial detachment (Interval improvement in central CNV / PED; stable improvement in SRF, interval improvement in cystic changes).   Notes *Images captured and stored on drive  Diagnosis / Impression:  Exu ARMD OU OD: Persistent central PED / CNV with SRF / SRHM overlying, Mild ERM OS: Interval improvement in central CNV / PED; stable improvement in SRF, interval improvement in cystic changes  Clinical management:  See below  Abbreviations: NFP - Normal foveal profile. CME - cystoid macular edema. PED - pigment epithelial detachment. IRF - intraretinal fluid. SRF - subretinal fluid. EZ - ellipsoid zone. ERM - epiretinal membrane. ORA - outer retinal atrophy. ORT - outer retinal tubulation. SRHM - subretinal hyper-reflective material. IRHM - intraretinal hyper-reflective material      Intravitreal Injection,  Pharmacologic Agent - OD - Right Eye       Time Out 10/22/2024. 9:35 AM. Confirmed correct patient, procedure, site, and patient consented.   Anesthesia Topical anesthesia was used. Anesthetic medications included Lidocaine 2%, Proparacaine 0.5%.   Procedure Preparation included 5% betadine to ocular surface, eyelid speculum. A (32g) needle was used.   Injection: 6 mg faricimab -svoa 6 MG/0.05ML (Patient supplied)   Route: Intravitreal, Site: Right Eye   NDC: 49757-903-98, Lot: [id number], Expiration date: 09/03/2026, Waste: 0 mL   Post-op Post injection exam found visual acuity of at least counting fingers. The patient tolerated the procedure well. There were no complications. The patient  received written and verbal post procedure care education. Post injection medications were not given.   Notes **SAMPLE MEDICATION ADMINISTERED**      Intravitreal Injection, Pharmacologic Agent - OS - Left Eye       Time Out 10/22/2024. 9:35 AM. Confirmed correct patient, procedure, site, and patient consented.   Anesthesia Topical anesthesia was used. Anesthetic medications included Lidocaine 2%, Proparacaine 0.5%.   Procedure Preparation included 5% betadine to ocular surface, eyelid speculum. A (32g) needle was used.   Injection: 6 mg faricimab -svoa 6 MG/0.05ML (Patient supplied)   Route: Intravitreal, Site: Left Eye   NDC: 49757-903-98, Lot: [id number], Expiration date: 09/03/2026, Waste: 0 mL   Post-op Post injection exam found visual acuity of at least counting fingers. The patient tolerated the procedure well. There were no complications. The patient received written and verbal post procedure care education. Post injection medications were not given.   Notes **SAMPLE MEDICATION ADMINISTERED**            ASSESSMENT/PLAN:    ICD-10-CM   1. Exudative age-related macular degeneration of both eyes with active choroidal neovascularization (HCC)  H35.3231 OCT, Retina - OU - Both  Eyes    Intravitreal Injection, Pharmacologic Agent - OD - Right Eye    Intravitreal Injection, Pharmacologic Agent - OS - Left Eye    faricimab -svoa (VABYSMO ) 6mg /0.85mL intravitreal injection    faricimab -svoa (VABYSMO ) 6mg /0.28mL intravitreal injection    2. Essential hypertension  I10     3. Hypertensive retinopathy of both eyes  H35.033     4. Pseudophakia, both eyes  Z96.1      Exudative age related macular degeneration, OU  - pt delayed to follow up from 6 weeks to 4 months on 06.30.25  - pt delayed to follow up from 4 weeks to 8 on 11.14.23  - pt delayed to follow up from 6 weeks to 8 on 12.10.24 - FA (09.13.23) shows OD: Focal peripapillary CNV with leakage superior disc; OS: Central CNV with central blockage from heme, +leakage - s/p IVA OD #1 (09.13.23), #2 (11.14.23), #3 (12.12.23), #4 (01.09.24), #5 (02.06.24) - s/p IVA OS #1 (08.16.23), #2 (09.13.23), #3 (11.14.23), #4 (12.12.23), #5 (01.09.24) -- IVA resistance - s/p IVE OS #1 (02.06.24), #2 (03.12.24), #3 (04.16.24), #4 (05.28.24), #5 (07.16.24), # (08.29.24), #7 (10.15.24), #8 (12.10.24), #9 (sample 01.21.25), #10 (02.25.25) -- IVE resistance ========== - s/p IVE OD #1 (06.30.25), #2 (08.12.25), #3 (08.12.25), #4 (10.07.25) - s/p IVV OS #1 (SAMPLE -- 06.30.25), #2 (08.12.25 sample), #3 (sample 08.12.25), #4 (10.07.25) ========== **h/o increased edema at 7 wks noted on 07.16.24 exam**  - BCVA OD 20/30 decreased from 20/25; OS 20/50  stable  **discussed decreased efficacy / resistance to Eylea  and potential benefit of switching from Eylea  to Vabsymo* - OCT shows NI:Ezmdpduzwu central PED / CNV with SRF / SRHM overlying, Mild ERM; OS: Interval improvement in central CNV / PED; stable improvement in SRF, interval improvement in cystic changes at 6 weeks  - recommend IVV OU (OD #1, OS #5 --SAMPLES) today, 11.18.25 w/ f/u down to 4 wks - pt wishes to proceed with injection OU  - RBA of procedure discussed, questions  answered - IVV informed consent obtained and signed, 07.03.25 (OU) - see procedure note - approved for Eylea  and Vabysmo  - pt would owe 20% coinsurance--pt being applied for Vabysmo  PAP  - f/u in 4wks -- DFE/OCT/possible injection(s)  2,3. Hypertensive retinopathy OU - discussed importance of tight BP control - continue to monitor  4. Pseudophakia  OU  - s/p CE/IOL OU (Dr. Cleatus)  - IOLs in good position, doing well  - continue to monitor  Ophthalmic Meds Ordered this visit:  Meds ordered this encounter  Medications   faricimab -svoa (VABYSMO ) 6mg /0.33mL intravitreal injection   faricimab -svoa (VABYSMO ) 6mg /0.82mL intravitreal injection     Return in about 4 weeks (around 11/19/2024) for exu ARMD OU, DFE, OCT.  There are no Patient Instructions on file for this visit.  Explained the diagnoses, plan, and follow up with the patient and they expressed understanding.  Patient expressed understanding of the importance of proper follow up care.   This document serves as a record of services personally performed by Redell JUDITHANN Hans, MD, PhD. It was created on their behalf by Wanda GEANNIE Keens, COT an ophthalmic technician. The creation of this record is the provider's dictation and/or activities during the visit.    Electronically signed by:  Wanda GEANNIE Keens, COT  10/27/24 4:17 PM  This document serves as a record of services personally performed by Redell JUDITHANN Hans, MD, PhD. It was created on their behalf by Almetta Pesa, an ophthalmic technician. The creation of this record is the provider's dictation and/or activities during the visit.    Electronically signed by: Almetta Pesa, OA, 10/27/24  4:17 PM   Redell JUDITHANN Hans, M.D., Ph.D. Diseases & Surgery of the Retina and Vitreous [facility]  I have reviewed the above documentation for accuracy and completeness, and I agree with the above. Redell JUDITHANN Hans, M.D., Ph.D. 10/27/24 4:21 PM    Abbreviations: M myopia (nearsighted); A astigmatism; H hyperopia (farsighted); P presbyopia; Mrx spectacle prescription;  CTL contact lenses; OD right eye; OS left eye; OU both eyes  XT exotropia; ET esotropia; PEK punctate epithelial keratitis; PEE punctate epithelial erosions; DES dry eye syndrome; MGD meibomian gland dysfunction; ATs artificial tears; PFAT's preservative free artificial tears; NSC nuclear sclerotic cataract; PSC posterior subcapsular cataract; ERM epi-retinal membrane; PVD posterior vitreous detachment; RD retinal detachment; DM diabetes mellitus; DR diabetic retinopathy; NPDR non-proliferative diabetic retinopathy; PDR proliferative diabetic retinopathy; CSME clinically significant macular edema; DME diabetic macular edema; dbh dot blot hemorrhages; CWS cotton wool spot; POAG primary open angle glaucoma; C/D cup-to-disc ratio; HVF humphrey visual field; GVF goldmann visual field; OCT optical coherence tomography; IOP intraocular pressure; BRVO Branch retinal vein occlusion; CRVO central retinal vein occlusion; CRAO central retinal artery occlusion; BRAO branch retinal artery occlusion; RT retinal tear; SB scleral buckle; PPV pars plana vitrectomy; VH Vitreous hemorrhage; PRP panretinal laser photocoagulation; IVK intravitreal kenalog; VMT vitreomacular traction; MH Macular hole;  NVD neovascularization of the disc; NVE neovascularization elsewhere; AREDS age related eye disease study; ARMD age related macular degeneration; POAG primary open angle glaucoma; EBMD epithelial/anterior basement membrane dystrophy; ACIOL anterior chamber intraocular lens; IOL intraocular lens; PCIOL posterior chamber intraocular lens; Phaco/IOL phacoemulsification with intraocular lens placement; PRK photorefractive keratectomy; LASIK laser assisted in situ keratomileusis; HTN hypertension; DM diabetes mellitus; COPD chronic obstructive pulmonary disease

## 2024-10-22 ENCOUNTER — Ambulatory Visit (INDEPENDENT_AMBULATORY_CARE_PROVIDER_SITE_OTHER): Admitting: Ophthalmology

## 2024-10-22 ENCOUNTER — Encounter (INDEPENDENT_AMBULATORY_CARE_PROVIDER_SITE_OTHER): Payer: Self-pay | Admitting: Ophthalmology

## 2024-10-22 DIAGNOSIS — Z961 Presence of intraocular lens: Secondary | ICD-10-CM | POA: Diagnosis not present

## 2024-10-22 DIAGNOSIS — H35033 Hypertensive retinopathy, bilateral: Secondary | ICD-10-CM

## 2024-10-22 DIAGNOSIS — I10 Essential (primary) hypertension: Secondary | ICD-10-CM

## 2024-10-22 DIAGNOSIS — H353231 Exudative age-related macular degeneration, bilateral, with active choroidal neovascularization: Secondary | ICD-10-CM

## 2024-10-22 MED ORDER — FARICIMAB-SVOA 6 MG/0.05ML IZ SOLN
6.0000 mg | INTRAVITREAL | Status: AC | PRN
  Administered 2024-10-22: 6 mg via INTRAVITREAL

## 2024-11-20 NOTE — Progress Notes (Shared)
 [facility] - Clinic Note  11/22/2024    CHIEF COMPLAINT Patient presents for No chief complaint on file.   HISTORY OF PRESENT ILLNESS: Audrey Sosa is a 89 y.o. female who presents to the clinic today for:      Pt states   Referring physician: Ransom Other, MD [address] [place],  [place] [address]  HISTORICAL INFORMATION:   Selected notes from the MEDICAL RECORD NUMBER Referred by Dr. Fleeta for ex ARMD LEE:  Ocular Hx- PMH-    CURRENT MEDICATIONS: No current outpatient medications on file. (Ophthalmic Drugs)   No current facility-administered medications for this visit. (Ophthalmic Drugs)   Current Outpatient Medications (Other)  Medication Sig   Cholecalciferol (VITAMIN D3) 1.25 MG (50000 UT) CAPS Take by mouth as directed.   Multiple Vitamins-Minerals (MULTIVITAMIN ADULT EXTRA C PO)    raloxifene (EVISTA) 60 MG tablet Take 60 mg by mouth daily.   simvastatin (ZOCOR) 20 MG tablet Take 20 mg by mouth daily.   valsartan-hydrochlorothiazide (DIOVAN-HCT) 160-12.5 MG tablet Take by mouth as directed.   No current facility-administered medications for this visit. (Other)   REVIEW OF SYSTEMS:    ALLERGIES Not on File  PAST MEDICAL HISTORY No past medical history on file. Past Surgical History:  Procedure Laterality Date   CATARACT EXTRACTION     FAMILY HISTORY Family History  Problem Relation Age of Onset   Macular degeneration Mother    Macular degeneration Father    SOCIAL HISTORY Social History   Tobacco Use   Smoking status: Never   Smokeless tobacco: Never  Vaping Use   Vaping status: Never Used       OPHTHALMIC EXAM:  Not recorded    IMAGING AND PROCEDURES  Imaging and Procedures for 11/22/2024           ASSESSMENT/PLAN:  No diagnosis found.  Exudative age related macular degeneration, OU  - pt delayed to follow up from 6 weeks to 4 months on 06.30.25  - pt delayed to follow up from 4  weeks to 8 on 11.14.23  - pt delayed to follow up from 6 weeks to 8 on 12.10.24 - FA (09.13.23) shows OD: Focal peripapillary CNV with leakage superior disc; OS: Central CNV with central blockage from heme, +leakage - s/p IVA OD #1 (09.13.23), #2 (11.14.23), #3 (12.12.23), #4 (01.09.24), #5 (02.06.24) - s/p IVA OS #1 (08.16.23), #2 (09.13.23), #3 (11.14.23), #4 (12.12.23), #5 (01.09.24) -- IVA resistance - s/p IVE OS #1 (02.06.24), #2 (03.12.24), #3 (04.16.24), #4 (05.28.24), #5 (07.16.24), # (08.29.24), #7 (10.15.24), #8 (12.10.24), #9 (sample 01.21.25), #10 (02.25.25) -- IVE resistance ========== - s/p IVE OD #1 (06.30.25), #2 (08.12.25), #3 (08.12.25), #4 (10.07.25) -s/p IVV OD #1 (11.18.25) sample  - s/p IVV OS #1 (SAMPLE -- 06.30.25), #2 (08.12.25 sample), #3 (sample 08.12.25), #4 (10.07.25) #5(11.18.25) sample  ========== **h/o increased edema at 7 wks noted on 07.16.24 exam**  - BCVA OD 20/30 decreased from 20/25; OS 20/50  stable  **discussed decreased efficacy / resistance to Eylea  and potential benefit of switching from Eylea  to Vabsymo* - OCT shows NI:Ezmdpduzwu central PED / CNV with SRF / SRHM overlying, Mild ERM; OS: Interval improvement in central CNV / PED; stable improvement in SRF, interval improvement in cystic changes at 6 weeks  - recommend IVV OU (OD #2, OS #6 --SAMPLES) today, 12.19.25 w/ f/u down to 4 wks - pt wishes to proceed with injection OU  - RBA of  procedure discussed, questions answered - IVV informed consent obtained and signed, 07.03.25 (OU) - see procedure note - approved for Eylea  and Vabysmo  - pt would owe 20% coinsurance--pt being applied for Vabysmo  PAP  - f/u in 4wks -- DFE/OCT/possible injection(s)  2,3. Hypertensive retinopathy OU - discussed importance of tight BP control - continue to monitor  4. Pseudophakia OU  - s/p CE/IOL OU (Dr. Cleatus)  - IOLs in good position, doing well  - continue to monitor  Ophthalmic Meds Ordered this visit:  No  orders of the defined types were placed in this encounter.    No follow-ups on file.  There are no Patient Instructions on file for this visit.  This document serves as a record of services personally performed by Redell JUDITHANN Hans, MD, PhD. It was created on their behalf by Delon Newness COT, an ophthalmic technician. The creation of this record is the provider's dictation and/or activities during the visit.    Electronically signed by: Delon Newness COT 12.17.25 10:39 AM  Abbreviations: M myopia (nearsighted); A astigmatism; H hyperopia (farsighted); P presbyopia; Mrx spectacle prescription;  CTL contact lenses; OD right eye; OS left eye; OU both eyes  XT exotropia; ET esotropia; PEK punctate epithelial keratitis; PEE punctate epithelial erosions; DES dry eye syndrome; MGD meibomian gland dysfunction; ATs artificial tears; PFAT's preservative free artificial tears; NSC nuclear sclerotic cataract; PSC posterior subcapsular cataract; ERM epi-retinal membrane; PVD posterior vitreous detachment; RD retinal detachment; DM diabetes mellitus; DR diabetic retinopathy; NPDR non-proliferative diabetic retinopathy; PDR proliferative diabetic retinopathy; CSME clinically significant macular edema; DME diabetic macular edema; dbh dot blot hemorrhages; CWS cotton wool spot; POAG primary open angle glaucoma; C/D cup-to-disc ratio; HVF humphrey visual field; GVF goldmann visual field; OCT optical coherence tomography; IOP intraocular pressure; BRVO Branch retinal vein occlusion; CRVO central retinal vein occlusion; CRAO central retinal artery occlusion; BRAO branch retinal artery occlusion; RT retinal tear; SB scleral buckle; PPV pars plana vitrectomy; VH Vitreous hemorrhage; PRP panretinal laser photocoagulation; IVK intravitreal kenalog; VMT vitreomacular traction; MH Macular hole;  NVD neovascularization of the disc; NVE neovascularization elsewhere; AREDS age related eye disease study; ARMD age related  macular degeneration; POAG primary open angle glaucoma; EBMD epithelial/anterior basement membrane dystrophy; ACIOL anterior chamber intraocular lens; IOL intraocular lens; PCIOL posterior chamber intraocular lens; Phaco/IOL phacoemulsification with intraocular lens placement; PRK photorefractive keratectomy; LASIK laser assisted in situ keratomileusis; HTN hypertension; DM diabetes mellitus; COPD chronic obstructive pulmonary disease

## 2024-11-21 ENCOUNTER — Encounter (INDEPENDENT_AMBULATORY_CARE_PROVIDER_SITE_OTHER): Admitting: Ophthalmology

## 2024-11-22 ENCOUNTER — Encounter (INDEPENDENT_AMBULATORY_CARE_PROVIDER_SITE_OTHER): Payer: Self-pay | Admitting: Ophthalmology

## 2024-11-22 ENCOUNTER — Ambulatory Visit (INDEPENDENT_AMBULATORY_CARE_PROVIDER_SITE_OTHER): Admitting: Ophthalmology

## 2024-11-22 DIAGNOSIS — H35033 Hypertensive retinopathy, bilateral: Secondary | ICD-10-CM | POA: Diagnosis not present

## 2024-11-22 DIAGNOSIS — H353231 Exudative age-related macular degeneration, bilateral, with active choroidal neovascularization: Secondary | ICD-10-CM

## 2024-11-22 DIAGNOSIS — Z961 Presence of intraocular lens: Secondary | ICD-10-CM | POA: Diagnosis not present

## 2024-11-22 DIAGNOSIS — I10 Essential (primary) hypertension: Secondary | ICD-10-CM

## 2024-11-22 MED ORDER — FARICIMAB-SVOA 6 MG/0.05ML IZ SOLN
6.0000 mg | INTRAVITREAL | Status: AC | PRN
  Administered 2024-11-22: 6 mg via INTRAVITREAL

## 2024-12-13 NOTE — Progress Notes (Signed)
 " [facility] - Clinic Note  12/26/2024    CHIEF COMPLAINT Patient presents for Retina Follow Up   HISTORY OF PRESENT ILLNESS: Audrey Sosa is a 89 y.o. female who presents to the clinic today for:   HPI     Retina Follow Up   In both eyes.  This started 4 weeks ago.  Duration of 4 weeks.  Since onset it is stable.  I, the attending physician,  performed the HPI with the patient and updated documentation appropriately.        Comments   4 week retina follow up ARMD and IVV OU pt is reporting no vision changes noticed she denies any flashes or floaters       Last edited by Valdemar Rogue, MD on 12/26/2024 12:36 PM.     Pt states vision is about the same.   Referring physician: Husain, Karrar, MD [address] [place],  [place] [address]  HISTORICAL INFORMATION:   Selected notes from the MEDICAL RECORD NUMBER Referred by Dr. Fleeta for ex ARMD LEE:  Ocular Hx- PMH-    CURRENT MEDICATIONS: No current outpatient medications on file. (Ophthalmic Drugs)   No current facility-administered medications for this visit. (Ophthalmic Drugs)   Current Outpatient Medications (Other)  Medication Sig   Cholecalciferol (VITAMIN D3) 1.25 MG (50000 UT) CAPS Take by mouth as directed.   Multiple Vitamins-Minerals (MULTIVITAMIN ADULT EXTRA C PO)    raloxifene (EVISTA) 60 MG tablet Take 60 mg by mouth daily.   simvastatin (ZOCOR) 20 MG tablet Take 20 mg by mouth daily.   valsartan-hydrochlorothiazide (DIOVAN-HCT) 160-12.5 MG tablet Take by mouth as directed.   No current facility-administered medications for this visit. (Other)   REVIEW OF SYSTEMS: ROS   Positive for: Eyes Negative for: Constitutional, Gastrointestinal, Neurological, Skin, Genitourinary, Musculoskeletal, HENT, Endocrine, Cardiovascular, Respiratory, Psychiatric, Allergic/Imm, Heme/Lymph Last edited by Resa Delon ORN, COT on 12/26/2024  8:33 AM.        ALLERGIES Not on  File  PAST MEDICAL HISTORY History reviewed. No pertinent past medical history. Past Surgical History:  Procedure Laterality Date   CATARACT EXTRACTION     FAMILY HISTORY Family History  Problem Relation Age of Onset   Macular degeneration Mother    Macular degeneration Father    SOCIAL HISTORY Social History   Tobacco Use   Smoking status: Never   Smokeless tobacco: Never  Vaping Use   Vaping status: Never Used       OPHTHALMIC EXAM:  Base Eye Exam     Visual Acuity (Snellen - Linear)       Right Left   Dist [place] 20/30 -2 20/50 -1   Dist ph [place] NI 20/40 -2         Tonometry (Tonopen, 8:37 AM)       Right Left   Pressure 13 12         Pupils       Pupils Dark Light Shape React APD   Right PERRL 3 2 Round Brisk None   Left PERRL 3 2 Round Brisk None         Visual Fields       Left Right    Full Full         Extraocular Movement       Right Left    Full, Ortho Full, Ortho         Neuro/Psych     Oriented x3: Yes  Mood/Affect: Normal         Dilation     Both eyes: 2.5% Phenylephrine @ 8:37 AM           Slit Lamp and Fundus Exam     Slit Lamp Exam       Right Left   Lids/Lashes Dermatochalasis - upper lid Dermatochalasis - upper lid   Conjunctiva/Sclera White and quiet White and quiet   Cornea arcus, trace PEE, mild tear film debris arcus, trace PEE, mild tear film debris   Anterior Chamber Deep and quiet Deep and quiet   Iris Round and dilated Round and dilated   Lens PC IOL in good position, trace Posterior capsular opacification PC IOL in good position, 1+ Posterior capsular opacification   Anterior Vitreous Vitreous syneresis Vitreous syneresis         Fundus Exam       Right Left   Disc Pink and Sharp, Compact, temporal PPA, peripapillary CNV with heme and edema superior disc--stably improved mild Pallor, Sharp rim, PPA   C/D Ratio 0.5 0.6   Macula Flat, Blunted foveal reflex, fine Drusen, RPE mottling and  clumping, central CNV with SRF / edema-- stably improved Blunted foveal reflex, central CNV with SRH/edema/cystic changes-- stably improved, Drusen, RPE mottling   Vessels attenuated, Tortuous attenuated, Tortuous   Periphery Attached, mild reticular degeneration, No heme Attached, mild reticular degeneration, No heme, focal peripheral drusen temporal periphery           IMAGING AND PROCEDURES  Imaging and Procedures for 12/26/2024  OCT, Retina - OU - Both Eyes       Right Eye Quality was good. Central Foveal Thickness: 278. Progression has been stable. Findings include normal foveal contour, no IRF, no SRF, retinal drusen , subretinal hyper-reflective material, choroidal neovascular membrane, epiretinal membrane, pigment epithelial detachment (Stable improvement in central PED / CNV and SRF / SRHM overlying, Mild ERM).   Left Eye Quality was good. Central Foveal Thickness: 230. Progression has been stable. Findings include no SRF, abnormal foveal contour, subretinal hyper-reflective material, intraretinal hyper-reflective material, intraretinal fluid, pigment epithelial detachment (stable improvement in central CNV / PED; stable improvement in SRF and cystic changes).   Notes *Images captured and stored on drive  Diagnosis / Impression:  Exu ARMD OU OD: stable improvement in central PED / CNV and SRF / SRHM overlying, Mild ERM OS: stable improvement in central CNV / PED; stable improvement in SRF and cystic changes  Clinical management:  See below  Abbreviations: NFP - Normal foveal profile. CME - cystoid macular edema. PED - pigment epithelial detachment. IRF - intraretinal fluid. SRF - subretinal fluid. EZ - ellipsoid zone. ERM - epiretinal membrane. ORA - outer retinal atrophy. ORT - outer retinal tubulation. SRHM - subretinal hyper-reflective material. IRHM - intraretinal hyper-reflective material      Intravitreal Injection, Pharmacologic Agent - OD - Right Eye       Time  Out 12/26/2024. 9:09 AM. Confirmed correct patient, procedure, site, and patient consented.   Anesthesia Topical anesthesia was used. Anesthetic medications included Lidocaine 2%, Proparacaine 0.5%.   Procedure Preparation included 5% betadine to ocular surface, eyelid speculum. A supplied (32g) needle was used.   Injection: 6 mg faricimab -svoa 6 MG/0.05ML   Route: Intravitreal, Site: Right Eye   NDC: 49757-903-93, Lot: [id number], Expiration date: 12/04/2025, Waste: 0 mL   Post-op Post injection exam found visual acuity of at least counting fingers. The patient tolerated the procedure well. There were no complications. The  patient received written and verbal post procedure care education. Post injection medications were not given.      Intravitreal Injection, Pharmacologic Agent - OS - Left Eye       Time Out 12/26/2024. 9:10 AM. Confirmed correct patient, procedure, site, and patient consented.   Anesthesia Topical anesthesia was used. Anesthetic medications included Lidocaine 2%, Proparacaine 0.5%.   Procedure Preparation included 5% betadine to ocular surface, eyelid speculum. A supplied (32g) needle was used.   Injection: 6 mg faricimab -svoa 6 MG/0.05ML   Route: Intravitreal, Site: Left Eye   NDC: 49757-903-93, Lot: [id number], Expiration date: 09/03/2025, Waste: 0 mL   Post-op Post injection exam found visual acuity of at least counting fingers. The patient tolerated the procedure well. There were no complications. The patient received written and verbal post procedure care education. Post injection medications were not given.            ASSESSMENT/PLAN:    ICD-10-CM   1. Exudative age-related macular degeneration of both eyes with active choroidal neovascularization (HCC)  H35.3231 OCT, Retina - OU - Both Eyes    Intravitreal Injection, Pharmacologic Agent - OD - Right Eye    Intravitreal Injection, Pharmacologic Agent - OS - Left Eye    faricimab -svoa (VABYSMO )  6mg /0.56mL intravitreal injection    faricimab -svoa (VABYSMO ) 6mg /0.49mL intravitreal injection    2. Essential hypertension  I10     3. Hypertensive retinopathy of both eyes  H35.033     4. Pseudophakia, both eyes  Z96.1      Exudative age related macular degeneration, OU  - pt delayed to follow up from 6 weeks to 4 months on 06.30.25  - pt delayed to follow up from 4 weeks to 8 on 11.14.23  - pt delayed to follow up from 6 weeks to 8 on 12.10.24 **h/o increased edema at 7 wks noted on 07.16.24 exam**  - BCVA OD 20/30 from 20/40; OS 20/40 stable - s/p IVA OD #1 (09.13.23), #2 (11.14.23), #3 (12.12.23), #4 (01.09.24), #5 (02.06.24) - s/p IVA OS #1 (08.16.23), #2 (09.13.23), #3 (11.14.23), #4 (12.12.23), #5 (01.09.24)  -- IVA resistance ========== - s/p IVE OS #1 (02.06.24), #2 (03.12.24), #3 (04.16.24), #4 (05.28.24), #5 (07.16.24), # (08.29.24), #7 (10.15.24), #8 (12.10.24), #9 (sample 01.21.25), #10 (02.25.25)  - s/p IVE OD #1 (06.30.25), #2 (08.12.25), #3 (08.12.25), #4 (10.07.25) -- IVE resistance =========== - s/p IVV OD #1 (11.18.25 sample), #2 (sample 12.19.25) - s/p IVV OS #1 (sample 06.30.25), #2 (08.12.25 sample), #3 (sample 08.12.25), #4 (10.07.25 sample), #5 (11.18.25 sample), #6 (12.19.25 sample) - FA (09.13.23) shows OD: Focal peripapillary CNV with leakage superior disc; OS: Central CNV with central blockage from heme, +leakage - OCT shows OD: stable improvement in central persistent central PED/CNV and SRF/SRHM overlying, Mild ERM; OS: stable improvement in central CNV/PED; stable improvement in SRF and cystic changes at 4+ weeks  - recommend IVV OU (OD #3, OS #7) today, 01.22.26 w/ f/u in 6 wks - pt wishes to proceed with injection OU  - RBA of procedure discussed, questions answered - IVV informed consent obtained and signed, 07.03.25 (OU) - see procedure note - approved for Eylea  and Vabysmo  - pt covering 20% co-insurance as of 01.22.26  - f/u in 6 wks --  DFE/OCT/possible injections  2,3. Hypertensive retinopathy OU - discussed importance of tight BP control - continue to monitor  4. Pseudophakia OU  - s/p CE/IOL OU (Dr. Cleatus)  - IOLs in good position, doing well  - continue to  monitor  Ophthalmic Meds Ordered this visit:  Meds ordered this encounter  Medications   faricimab -svoa (VABYSMO ) 6mg /0.41mL intravitreal injection   faricimab -svoa (VABYSMO ) 6mg /0.55mL intravitreal injection     Return in about 6 weeks (around 02/06/2025) for f/u, Ex. AMD, DFE, OCT, Possible, IVV, OU.  There are no Patient Instructions on file for this visit.  This document serves as a record of services personally performed by Redell JUDITHANN Hans, MD, PhD. It was created on their behalf by Avelina Pereyra, COA an ophthalmic technician. The creation of this record is the provider's dictation and/or activities during the visit.   Electronically signed by: Avelina GORMAN Pereyra, COT  12/26/24  12:40 PM   This document serves as a record of services personally performed by Redell JUDITHANN Hans, MD, PhD. It was created on their behalf by Almetta Pesa, an ophthalmic technician. The creation of this record is the provider's dictation and/or activities during the visit.    Electronically signed by: Almetta Pesa, OA, 12/26/24  12:40 PM  This document serves as a record of services personally performed by Redell JUDITHANN Hans, MD, PhD. It was created on their behalf by Wanda GEANNIE Keens, COT an ophthalmic technician. The creation of this record is the provider's dictation and/or activities during the visit.    Electronically signed by:  Wanda GEANNIE Keens, COT  12/26/24 12:40 PM  Redell JUDITHANN Hans, M.D., Ph.D. Diseases & Surgery of the Retina and Vitreous [facility] 12/26/2024  I have reviewed the above documentation for accuracy and completeness, and I agree with the above. Redell JUDITHANN Hans, M.D., Ph.D. 12/26/24 12:42 PM   Abbreviations: M myopia  (nearsighted); A astigmatism; H hyperopia (farsighted); P presbyopia; Mrx spectacle prescription;  CTL contact lenses; OD right eye; OS left eye; OU both eyes  XT exotropia; ET esotropia; PEK punctate epithelial keratitis; PEE punctate epithelial erosions; DES dry eye syndrome; MGD meibomian gland dysfunction; ATs artificial tears; PFAT's preservative free artificial tears; NSC nuclear sclerotic cataract; PSC posterior subcapsular cataract; ERM epi-retinal membrane; PVD posterior vitreous detachment; RD retinal detachment; DM diabetes mellitus; DR diabetic retinopathy; NPDR non-proliferative diabetic retinopathy; PDR proliferative diabetic retinopathy; CSME clinically significant macular edema; DME diabetic macular edema; dbh dot blot hemorrhages; CWS cotton wool spot; POAG primary open angle glaucoma; C/D cup-to-disc ratio; HVF humphrey visual field; GVF goldmann visual field; OCT optical coherence tomography; IOP intraocular pressure; BRVO Branch retinal vein occlusion; CRVO central retinal vein occlusion; CRAO central retinal artery occlusion; BRAO branch retinal artery occlusion; RT retinal tear; SB scleral buckle; PPV pars plana vitrectomy; VH Vitreous hemorrhage; PRP panretinal laser photocoagulation; IVK intravitreal kenalog; VMT vitreomacular traction; MH Macular hole;  NVD neovascularization of the disc; NVE neovascularization elsewhere; AREDS age related eye disease study; ARMD age related macular degeneration; POAG primary open angle glaucoma; EBMD epithelial/anterior basement membrane dystrophy; ACIOL anterior chamber intraocular lens; IOL intraocular lens; PCIOL posterior chamber intraocular lens; Phaco/IOL phacoemulsification with intraocular lens placement; PRK photorefractive keratectomy; LASIK laser assisted in situ keratomileusis; HTN hypertension; DM diabetes mellitus; COPD chronic obstructive pulmonary disease "

## 2024-12-26 ENCOUNTER — Ambulatory Visit (INDEPENDENT_AMBULATORY_CARE_PROVIDER_SITE_OTHER): Admitting: Ophthalmology

## 2024-12-26 ENCOUNTER — Encounter (INDEPENDENT_AMBULATORY_CARE_PROVIDER_SITE_OTHER): Payer: Self-pay | Admitting: Ophthalmology

## 2024-12-26 DIAGNOSIS — I10 Essential (primary) hypertension: Secondary | ICD-10-CM

## 2024-12-26 DIAGNOSIS — H353231 Exudative age-related macular degeneration, bilateral, with active choroidal neovascularization: Secondary | ICD-10-CM | POA: Diagnosis not present

## 2024-12-26 DIAGNOSIS — Z961 Presence of intraocular lens: Secondary | ICD-10-CM | POA: Diagnosis not present

## 2024-12-26 DIAGNOSIS — H35033 Hypertensive retinopathy, bilateral: Secondary | ICD-10-CM

## 2024-12-26 MED ORDER — FARICIMAB-SVOA 6 MG/0.05ML IZ SOSY
6.0000 mg | PREFILLED_SYRINGE | INTRAVITREAL | Status: AC | PRN
  Administered 2024-12-26: 6 mg via INTRAVITREAL

## 2025-02-06 ENCOUNTER — Encounter (INDEPENDENT_AMBULATORY_CARE_PROVIDER_SITE_OTHER): Admitting: Ophthalmology
